# Patient Record
Sex: Female | Born: 1975 | Race: White | Hispanic: No | Marital: Married | Smoking: Never smoker
Health system: Southern US, Community
[De-identification: ages and names within clinical notes are randomized; demographics above are authoritative.]

## PROBLEM LIST (undated history)

## (undated) DIAGNOSIS — R000 Tachycardia, unspecified: Secondary | ICD-10-CM

## (undated) DIAGNOSIS — E785 Hyperlipidemia, unspecified: Secondary | ICD-10-CM

## (undated) DIAGNOSIS — R002 Palpitations: Secondary | ICD-10-CM

## (undated) DIAGNOSIS — K589 Irritable bowel syndrome without diarrhea: Secondary | ICD-10-CM

## (undated) DIAGNOSIS — J309 Allergic rhinitis, unspecified: Secondary | ICD-10-CM

## (undated) DIAGNOSIS — M1990 Unspecified osteoarthritis, unspecified site: Secondary | ICD-10-CM

## (undated) HISTORY — DX: Irritable bowel syndrome, unspecified: K58.9

## (undated) HISTORY — DX: Tachycardia, unspecified: R00.0

## (undated) HISTORY — DX: Allergic rhinitis, unspecified: J30.9

## (undated) HISTORY — PX: WISDOM TOOTH EXTRACTION: SHX21

## (undated) HISTORY — DX: Unspecified osteoarthritis, unspecified site: M19.90

## (undated) HISTORY — DX: Hyperlipidemia, unspecified: E78.5

## (undated) HISTORY — DX: Palpitations: R00.2

---

## 2001-03-08 ENCOUNTER — Other Ambulatory Visit: Admission: RE | Admit: 2001-03-08 | Discharge: 2001-03-08 | Payer: Self-pay | Admitting: Obstetrics and Gynecology

## 2001-04-26 ENCOUNTER — Encounter (INDEPENDENT_AMBULATORY_CARE_PROVIDER_SITE_OTHER): Payer: Self-pay | Admitting: Specialist

## 2001-04-26 ENCOUNTER — Other Ambulatory Visit: Admission: RE | Admit: 2001-04-26 | Discharge: 2001-04-26 | Payer: Self-pay | Admitting: Gynecology

## 2001-10-13 ENCOUNTER — Other Ambulatory Visit: Admission: RE | Admit: 2001-10-13 | Discharge: 2001-10-13 | Payer: Self-pay | Admitting: Gynecology

## 2002-02-06 ENCOUNTER — Other Ambulatory Visit: Admission: RE | Admit: 2002-02-06 | Discharge: 2002-02-06 | Payer: Self-pay | Admitting: Gynecology

## 2002-04-26 ENCOUNTER — Other Ambulatory Visit: Admission: RE | Admit: 2002-04-26 | Discharge: 2002-04-26 | Payer: Self-pay | Admitting: Gynecology

## 2002-08-16 ENCOUNTER — Other Ambulatory Visit: Admission: RE | Admit: 2002-08-16 | Discharge: 2002-08-16 | Payer: Self-pay | Admitting: Gynecology

## 2003-04-19 ENCOUNTER — Encounter: Admission: RE | Admit: 2003-04-19 | Discharge: 2003-04-19 | Payer: Self-pay | Admitting: *Deleted

## 2003-04-19 ENCOUNTER — Encounter: Payer: Self-pay | Admitting: *Deleted

## 2003-06-27 ENCOUNTER — Other Ambulatory Visit: Admission: RE | Admit: 2003-06-27 | Discharge: 2003-06-27 | Payer: Self-pay | Admitting: Gynecology

## 2004-07-07 ENCOUNTER — Other Ambulatory Visit: Admission: RE | Admit: 2004-07-07 | Discharge: 2004-07-07 | Payer: Self-pay | Admitting: Gynecology

## 2005-05-06 ENCOUNTER — Other Ambulatory Visit: Admission: RE | Admit: 2005-05-06 | Discharge: 2005-05-06 | Payer: Self-pay | Admitting: Gynecology

## 2005-09-01 ENCOUNTER — Encounter: Admission: RE | Admit: 2005-09-01 | Discharge: 2005-09-01 | Payer: Self-pay | Admitting: Gynecology

## 2005-11-12 ENCOUNTER — Inpatient Hospital Stay (HOSPITAL_COMMUNITY): Admission: AD | Admit: 2005-11-12 | Discharge: 2005-11-17 | Payer: Self-pay | Admitting: Gynecology

## 2005-11-14 ENCOUNTER — Encounter (INDEPENDENT_AMBULATORY_CARE_PROVIDER_SITE_OTHER): Payer: Self-pay | Admitting: Specialist

## 2005-12-30 ENCOUNTER — Other Ambulatory Visit: Admission: RE | Admit: 2005-12-30 | Discharge: 2005-12-30 | Payer: Self-pay | Admitting: Gynecology

## 2007-12-19 ENCOUNTER — Inpatient Hospital Stay (HOSPITAL_COMMUNITY): Admission: RE | Admit: 2007-12-19 | Discharge: 2007-12-22 | Payer: Self-pay | Admitting: Obstetrics & Gynecology

## 2010-02-10 ENCOUNTER — Inpatient Hospital Stay (HOSPITAL_COMMUNITY): Admission: EM | Admit: 2010-02-10 | Discharge: 2010-02-15 | Payer: Self-pay | Admitting: Emergency Medicine

## 2010-02-12 ENCOUNTER — Encounter (INDEPENDENT_AMBULATORY_CARE_PROVIDER_SITE_OTHER): Payer: Self-pay | Admitting: Internal Medicine

## 2011-01-28 LAB — HEPARIN INDUCED THROMBOCYTOPENIA PNL
Heparin Induced Plt Ab: NEGATIVE
Patient O.D.: 0.091
UFH Low Dose 0.1 IU/mL: 0 % Release

## 2011-01-28 LAB — CLOSTRIDIUM DIFFICILE EIA

## 2011-01-28 LAB — COMPREHENSIVE METABOLIC PANEL
Albumin: 2.5 g/dL — ABNORMAL LOW (ref 3.5–5.2)
Albumin: 3.5 g/dL (ref 3.5–5.2)
BUN: 2 mg/dL — ABNORMAL LOW (ref 6–23)
BUN: 6 mg/dL (ref 6–23)
Calcium: 8.4 mg/dL (ref 8.4–10.5)
Creatinine, Ser: 0.56 mg/dL (ref 0.4–1.2)
Creatinine, Ser: 0.67 mg/dL (ref 0.4–1.2)
Potassium: 4 mEq/L (ref 3.5–5.1)
Total Bilirubin: 0.7 mg/dL (ref 0.3–1.2)
Total Protein: 4.9 g/dL — ABNORMAL LOW (ref 6.0–8.3)
Total Protein: 6.3 g/dL (ref 6.0–8.3)

## 2011-01-28 LAB — BASIC METABOLIC PANEL
BUN: 3 mg/dL — ABNORMAL LOW (ref 6–23)
CO2: 21 mEq/L (ref 19–32)
CO2: 22 mEq/L (ref 19–32)
Calcium: 8.3 mg/dL — ABNORMAL LOW (ref 8.4–10.5)
Chloride: 108 mEq/L (ref 96–112)
Chloride: 111 mEq/L (ref 96–112)
Creatinine, Ser: 0.69 mg/dL (ref 0.4–1.2)
GFR calc Af Amer: >60 mL/min (ref >60)
GFR calc non Af Amer: >60 mL/min (ref >60)
GFR calc non Af Amer: >60 mL/min (ref >60)
Glucose, Bld: 91 mg/dL (ref 70–99)
Potassium: 4.5 mEq/L (ref 3.5–5.1)
Sodium: 137 mEq/L (ref 135–145)

## 2011-01-28 LAB — URINALYSIS, ROUTINE W REFLEX MICROSCOPIC
Glucose, UA: NEGATIVE mg/dL
Hgb urine dipstick: NEGATIVE
Specific Gravity, Urine: 1.023 (ref 1.005–1.030)
Urobilinogen, UA: 0.2 mg/dL (ref 0.0–1.0)

## 2011-01-28 LAB — CBC
HCT: 33.6 % — ABNORMAL LOW (ref 36.0–46.0)
HCT: 35.3 % — ABNORMAL LOW (ref 36.0–46.0)
HCT: 43.8 % (ref 36.0–46.0)
Hemoglobin: 12.2 g/dL (ref 12.0–15.0)
MCV: 86.3 fL (ref 78.0–100.0)
MCV: 87.2 fL (ref 78.0–100.0)
Platelets: 159 10*3/uL (ref 150–400)
Platelets: 185 10*3/uL (ref 150–400)
RDW: 12.6 % (ref 11.5–15.5)
RDW: 13 % (ref 11.5–15.5)
RDW: 13 % (ref 11.5–15.5)

## 2011-01-28 LAB — PHOSPHORUS: Phosphorus: 2.3 mg/dL (ref 2.3–4.6)

## 2011-01-28 LAB — DIFFERENTIAL
Basophils Absolute: 0.1 10*3/uL (ref 0.0–0.1)
Lymphocytes Relative: 10 % — ABNORMAL LOW (ref 12–46)
Monocytes Absolute: 0.8 10*3/uL (ref 0.1–1.0)
Neutro Abs: 11.5 10*3/uL — ABNORMAL HIGH (ref 1.7–7.7)
Neutrophils Relative %: 83 % — ABNORMAL HIGH (ref 43–77)

## 2011-01-28 LAB — MAGNESIUM: Magnesium: 1.8 mg/dL (ref 1.5–2.5)

## 2011-01-28 LAB — PROTIME-INR: INR: 1.01 (ref 0.00–1.49)

## 2011-01-28 LAB — PREGNANCY, URINE: Preg Test, Ur: NEGATIVE

## 2011-03-24 NOTE — Op Note (Signed)
NAME:  Rhonda Drake, Rhonda Drake            ACCOUNT NO.:  [id number]   MEDICAL RECORD NO.:  [id number]          PATIENT TYPE:  INP   LOCATION:  9113                          FACILITY:  [facility]   PHYSICIAN:  Genia Del, M.D.DATE OF BIRTH:  1976-09-21   DATE OF PROCEDURE:  12/19/2007  DATE OF DISCHARGE:                               OPERATIVE REPORT   PREOPERATIVE DIAGNOSES:  Thirty-nine weeks gestation, previous cesarean  section.   POSTOPERATIVE DIAGNOSES:  Thirty-nine weeks gestation, previous cesarean  section.   PROCEDURE:  Repeat elective low transverse C-section.   ASSISTANT:  Marlinda Mike, C.N.M.   ANESTHESIOLOGIST:  Octaviano Glow. Pamalee Leyden, M.D.   PROCEDURE:  Under spinal anesthesia, the patient is in the 15-degrees  left decubitus position.  She was prepped with Betadine on the  abdominal, suprapubic, vulvar and vaginal areas, and draped as usual.  The bladder catheter is inserted, the Foley is left in place.  We  verified the level of anesthesia which was adequate.  We infiltrated the  subcutaneous tissue with Marcaine 0.25 plain 20 mL at the site of the  previous C-section.  We opened with the scalpel at the site of the  previous C-section scar in a Pfannenstiel incision.  We opened the  adipose tissue with the electrocautery cutting mode.  We opened the  aponeurosis transversely with the electrocautery and the Mayo scissors.  We detached the recti muscles from the aponeurosis on the midline  superiorly and inferiorly.  We opened the parietal peritoneum  longitudinally with [facility] scissors.  We then placed the bladder  retractor.  We opened the visceral peritoneum over the lower uterine  segment transversely with Mayo scissors and reclined the bladder  downward.  We repositioned the bladder retractor.  We make a low  transverse hysterotomy with a scalpel.  We extended on each side with  dressing scissors.  The amniotic fluid is clear.  The fetus is in  cephalic presentation.  Birth  of a baby boy, Apgars are 9 and 9, the  cord is clamped and cut, the baby is given to the neonatal team.  We  evacuated the placenta spontaneously, it is complete with 3 vessels.  It  is sent to labor and delivery.  Uterine revision done, the uterus  contracts well.  Pitocin was started in the IV fluids.  A dose of Flagyl  500 IV is given.  We exteriorized the uterus.  We closed the hysterotomy  with first a locked running suture of Vicryl 0, a second layer in a  mattress fashion is done with Vicryl 0.  Hemostasis is adequate at all  levels.  The tubes and ovaries are normal in size and appearance.  We  repositioned the uterus inside the abdomen.  We then verified hemostasis  on the recti muscles, aponeurosis and bladder flap.  We completed  hemostasis with the electrocautery where necessary.  We then closed the  aponeurosis in 2 half running sutures of Vicryl 0.  We completed  hemostasis on the adipose tissue with the electrocautery.  We  reapproximated the skin with staples and a dry dressing is applied.  The  count of instruments and sponges was complete x2.  The estimated blood  loss was 600 mL and no complications occurred.  The patient was brought  to recovery room in good stable status .     Genia Del, M.D.  Electronically Signed    ML/MEDQ  D:  12/19/2007  T:  12/20/2007  Job:  [id number]

## 2011-03-27 NOTE — Op Note (Signed)
NAME:  Rhonda Drake, Rhonda Drake            ACCOUNT NO.:  [id number]   MEDICAL RECORD NO.:  [id number]          PATIENT TYPE:  INP   LOCATION:  9117                          FACILITY:  [facility]   PHYSICIAN:  Juan H. Lily Peer, M.D.DATE OF BIRTH:  Jun 02, 1976   DATE OF PROCEDURE:  11/14/2005  DATE OF DISCHARGE:                                 OPERATIVE REPORT   Juan H. Lily Peer, M.D.   ASSISTANT:  Naima A. Normand Sloop, M.D.   INDICATIONS FOR OPERATION:  A 35 year old gravida 1, para 0, at 39-6/7  weeks' gestation admitted for induction secondary to dates and gestational  diabetes.  She had failed two days of induction with essentially no change  in her cervix and was taken to the operating room for a primary cesarean  section.   PREOPERATIVE DIAGNOSIS:  1.  Term intrauterine pregnancy.  2.  Gestational diabetic.  3.  Failed induction.   POSTOPERATIVE DIAGNOSIS:  1.  Term intrauterine pregnancy.  2.  Gestational diabetes.  3.  Failed induction.  4.  Fetal macrosomia.   ANESTHESIA:  Spinal.   PROCEDURE PERFORMED:  Primary lower uterine segment transverse cesarean  section.   FINDINGS:  Viable female infant, weight of 9 pounds 10 ounces.  Arterial cord  pH of 7.27, Apgars of 9 and 9.  Normal maternal pelvic anatomy.   DESCRIPTION OF OPERATION:  After the patient was taken to the operating room  and underwent successful spinal anesthesia, fetal heart tones were  appreciated, and the abdomen was then prepped and draped in the usual  sterile fashion.  A Pfannenstiel skin incision was made after a Foley  catheter had been inserted in an effort to monitor urinary output.  The  Pfannenstiel skin incision was carried down from the skin, subcutaneous  tissue down to the rectus fascia whereby a midline nick was made.  The  fascia was incised in a transverse fashion.  The peritoneal cavity was  entered cautiously.  The bladder flap was then was developed and the lower  uterine segment was incised in a  transverse fashion.  Clear amniotic fluid  was present.  The newborn's head was delivered. The nasopharyngeal area was  bulb suctioned. The newborn was delivered.  The cord was doubly clamped and  excised.  The newborn gave an immediate cry, was shown to the parents and  passed off to the neonatologist who was in attendance, again with the above-  mentioned parameters.  After cord blood was obtained, the placenta was  delivered from the intrauterine cavity and submitted for histological  evaluation.  Pitocin drip was started.  Due to the patient's allergy to  penicillin, she was given clindamycin 900 mg IV for prophylaxis.  The uterus  was then exteriorized.  The intrauterine cavity was swept clear of remaining  products of conception.  The uterine incision was closed in a double layer  fashion, first with a locking stitch of 0 Vicryl suture, the second layer an  imbricating stitch with similar suture material.  Normal pelvic anatomy was  noted.  The uterus was placed back in the pelvic cavity.  The pelvic cavity  was copiously irrigated with normal saline solution.  After ascertaining  adequate hemostasis, closure was started.  The visceral peritoneum was not  reapproximated, but the rectus fascia was closed with a running stitch of 0  Vicryl suture/  Marcaine 0.25% was infiltrated subcutaneously, and then the  subcutaneous bleeders were Bovie cauterized and skin was reapproximated with  skin clips followed by placing  Xeroform gauze and 4 x 8 dressing.  The  patient was transferred to the recovery room with stable vital signs.  Blood  loss procedure was 750 mL.  Urine output was 300 mL and clear, and IV fluids  consisted of 2300 mL of lactated Ringer's.      Juan H. Lily Peer, M.D.  Electronically Signed     JHF/MEDQ  D:  11/14/2005  T:  11/14/2005  Job:  [id number]

## 2011-03-27 NOTE — H&P (Signed)
NAME:  JOZELYNN, DANIELSON            ACCOUNT NO.:  [id number]   MEDICAL RECORD NO.:  [id number]          PATIENT TYPE:  INP   LOCATION:  9164                          FACILITY:  [facility]   PHYSICIAN:  Ivor Costa. Farrel Gobble, M.D. DATE OF BIRTH:  [birth year]   DATE OF ADMISSION:  11/12/2005  DATE OF DISCHARGE:                                HISTORY & PHYSICAL   CHIEF COMPLAINT:  39-6/7 weeks pregnancy with gestational diabetes.   HISTORY OF PRESENT ILLNESS:  The patient is a 35 year old G1 with an  estimated date of confinement of November 17, 2005, estimated gestational age  of 39-5/7 weeks who presents for an induction of labor secondary to her  gestational diabetes status. The patient has been controlled with diet and  her finger sticks have all been normal. The patient has been followed with  NSTs because of the gestational diabetes and they have all been reactive.  The patient is without any complaints at this point. She reports good fetal  movement, no vaginal bleeding and no contractions. When she presented today  the patient was noted to have a fundal height of 40. Her cervix was long and  closed and the vertex was not in the pelvis. Therefore the patient had an  ultrasound done which was consistent with gestational age  with an estimated fetal weight of 3790 kg and an AFI of 17.6. Her nonstress  test today was reactive. Because her cervix is unfavorable the patient was  offered a delay in her induction, however she did decline. She is antibody  negative. RPR nonreactive. Rubella immune. Hepatitis B surface antigen  nonreactive. HIV nonreactive. GBS negative. Refer to the [facility].   PHYSICAL EXAMINATION:  GENERAL:  She is a well-appearing gravida in no acute  distress.  HEART:  Regular rate.  LUNGS:  Clear to auscultation.  ABDOMEN:  Gravid and nontender with a fundal height of 40.  VAGINAL EXAM:  Her cervix is long, closed, and posterior.  EXTREMITIES:  Remarkable only for trace  edema.   ASSESSMENT:  Term pregnancy, A1 diabetes.  The patient will be admitted for Cervidil this evening followed by high dose  Pitocin in the morning. She was counseled with regards to the possibility  for a failed induction as well as serial induction and all questions were  addressed and accepted.      Ivor Costa. Farrel Gobble, M.D.  Electronically Signed     THL/MEDQ  D:  11/12/2005  T:  11/12/2005  Job:  [id number]

## 2011-03-27 NOTE — Discharge Summary (Signed)
NAME:  Rhonda Drake, Rhonda Drake            ACCOUNT NO.:  [id number]   MEDICAL RECORD NO.:  [id number]          PATIENT TYPE:  INP   LOCATION:  9117                          [facility]ACILITY:  [facility]   PHYSICIAN:  Timothy P. Fontaine, M.D.DATE OF BIRTH:  1976/05/04   DATE OF ADMISSION:  11/12/2005  DATE OF DISCHARGE:                                 DISCHARGE SUMMARY   DISCHARGE DIAGNOSES:  1.  Pregnancy at 39 weeks, delivered.  2.  Gestational diabetes.  3.  Failed induction.   PROCEDURE:  Primary low transverse cervical cesarean section, Dr. Reynaldo Minium - November 14, 2005.   HOSPITAL COURSE:  A 35 year old G1 P0 female at [redacted] weeks gestation,  gestational diabetes, admitted for induction. The patient underwent a staged  two-day induction with Cervidil and Pitocin. Her cervix remained unfavorable  at long, closed, posterior, and she ultimately underwent a primary cesarean  section producing a 9-pound 10-ounce female infant, Apgars 9 and 9. The  patient's postoperative course was uncomplicated and she was discharged on  postoperative day #3 ambulating well, tolerating a regular diet, with a  postoperative hemoglobin of 10.4. The patient is Rh negative and the infant  was reported to be Rh negative. The patient's rubella titer is positive. The  patient received precautions, instructions and follow-up, will be seen in  the office in 6 weeks, received a prescription for Tylox #30 one to two p.o.  q.4-6h. p.r.n. pain.      Timothy P. Fontaine, M.D.  Electronically Signed     TPF/MEDQ  D:  11/17/2005  T:  11/17/2005  Job:  [id number]

## 2011-03-27 NOTE — Discharge Summary (Signed)
Rhonda Drake, Rhonda Drake            ACCOUNT NO.:  [id number]   MEDICAL RECORD NO.:  [id number]          PATIENT TYPE:  INP   LOCATION:  9113                          FACILITY:  [facility]   PHYSICIAN:  Genia Del, M.D.DATE OF BIRTH:  18-Dec-1975   DATE OF ADMISSION:  12/19/2007  DATE OF DISCHARGE:  12/22/2007                               DISCHARGE SUMMARY   ADMISSION DIAGNOSIS:  A 39 weeks' gestation, previous cesarean section.   DISCHARGE DIAGNOSIS:  1. A 39 weeks' gestation, cesarean section.  2. Birth of a healthy baby boy.   PROCEDURE:  Repeat low-transverse cesarean section.   HOSPITAL COURSE:  The C-section was without any complication.  The  patient had a normal postop course.  She remained afebrile and  hemodynamically stable.  Her hemoglobin postop was at 11.1 with a  hematocrit of 31.4.  She had a mild decrease in her platelets at 112 and  they were going up on the day prior to discharge at 120.  The patient  was discharge on postop day #3 in stable status.  Postop advice were  given.  Pain medication was prescribed.  The patient will follow up at  [facility] in 6 weeks.      Genia Del, M.D.  Electronically Signed     ML/MEDQ  D:  01/26/2008  T:  01/27/2008  Job:  [id number]

## 2011-03-30 ENCOUNTER — Other Ambulatory Visit: Payer: Self-pay | Admitting: Obstetrics & Gynecology

## 2011-07-31 LAB — CBC
HCT: 31.4 — ABNORMAL LOW
HCT: 31.8 — ABNORMAL LOW
HCT: 39.3
Hemoglobin: 11.1 — ABNORMAL LOW
Hemoglobin: 11.3 — ABNORMAL LOW
Hemoglobin: 13.9
MCHC: 35.5
MCV: 86.1
Platelets: 120 — ABNORMAL LOW
RBC: 4.56
RDW: 16 — ABNORMAL HIGH
WBC: 11.5 — ABNORMAL HIGH
WBC: 9.9

## 2011-07-31 LAB — RH IMMUNE GLOB WKUP(>/=20WKS)(NOT WOMEN'S HOSP)

## 2013-09-27 ENCOUNTER — Other Ambulatory Visit: Payer: Self-pay | Admitting: Family Medicine

## 2013-09-27 ENCOUNTER — Ambulatory Visit
Admission: RE | Admit: 2013-09-27 | Discharge: 2013-09-27 | Disposition: A | Discharge status: Home / Self Care | Payer: BC Managed Care – PPO | Source: Ambulatory Visit | Attending: Family Medicine | Admitting: Family Medicine

## 2013-09-27 DIAGNOSIS — M79671 Pain in right foot: Secondary | ICD-10-CM

## 2014-02-26 ENCOUNTER — Other Ambulatory Visit: Payer: Self-pay | Admitting: Dermatology

## 2017-06-07 ENCOUNTER — Encounter: Payer: Self-pay | Admitting: Obstetrics & Gynecology

## 2017-11-23 ENCOUNTER — Encounter: Payer: Self-pay | Admitting: Obstetrics & Gynecology

## 2017-11-23 ENCOUNTER — Ambulatory Visit: Payer: BC Managed Care – PPO | Admitting: Obstetrics & Gynecology

## 2017-11-23 VITALS — BP 130/86 | Ht 63.75 in | Wt 168.0 lb

## 2017-11-23 DIAGNOSIS — Z3041 Encounter for surveillance of contraceptive pills: Secondary | ICD-10-CM

## 2017-11-23 DIAGNOSIS — Z01419 Encounter for gynecological examination (general) (routine) without abnormal findings: Secondary | ICD-10-CM | POA: Diagnosis not present

## 2017-11-23 MED ORDER — DROSPIRENONE-ETHINYL ESTRADIOL 3-0.02 MG PO TABS: 1.0000 | ORAL_TABLET | Freq: Every day | ORAL | 4 refills | Status: DC

## 2017-11-23 NOTE — Patient Instructions (Signed)
1. Well female exam with routine gynecological exam Normal gynecologic exam.  Last Pap test December 2000 17- with HPV high risk negative.  Will repeat Pap test next year.  Breast exam normal.  Screening mammogram negative in July 2018.  Physically active and working on weight loss currently.  2. Encounter for surveillance of contraceptive pills Loryna represcribed.  Doing well on continuous use.  No contraindication.  Other orders - drospirenone-ethinyl estradiol (YAZ,GIANVI,LORYNA) 3-0.02 MG tablet; Take 1 tablet by mouth daily. Continuous use  [place], it was a pleasure seeing you today!  Health Maintenance, Female Adopting a healthy lifestyle and getting preventive care can go a long way to promote health and wellness. Talk with your health care provider about what schedule of regular examinations is right for you. This is a good chance for you to check in with your provider about disease prevention and staying healthy. In between checkups, there are plenty of things you can do on your own. Experts have done a lot of research about which lifestyle changes and preventive measures are most likely to keep you healthy. Ask your health care provider for more information. Weight and diet Eat a healthy diet  Be sure to include plenty of vegetables, fruits, low-fat dairy products, and lean protein.  Do not eat a lot of foods high in solid fats, added sugars, or salt.  Get regular exercise. This is one of the most important things you can do for your health. ? Most adults should exercise for at least 150 minutes each week. The exercise should increase your heart rate and make you sweat (moderate-intensity exercise). ? Most adults should also do strengthening exercises at least twice a week. This is in addition to the moderate-intensity exercise.  Maintain a healthy weight  Body mass index (BMI) is a measurement that can be used to identify possible weight problems. It estimates body fat based on  height and weight. Your health care provider can help determine your BMI and help you achieve or maintain a healthy weight.  For females 46 years of age and older: ? A BMI below 18.5 is considered underweight. ? A BMI of 18.5 to 24.9 is normal. ? A BMI of 25 to 29.9 is considered overweight. ? A BMI of 30 and above is considered obese.  Watch levels of cholesterol and blood lipids  You should start having your blood tested for lipids and cholesterol at 42 years of age, then have this test every 5 years.  You may need to have your cholesterol levels checked more often if: ? Your lipid or cholesterol levels are high. ? You are older than 42 years of age. ? You are at high risk for heart disease.  Cancer screening Lung Cancer  Lung cancer screening is recommended for adults 10-84 years old who are at high risk for lung cancer because of a history of smoking.  A yearly low-dose CT scan of the lungs is recommended for people who: ? Currently smoke. ? Have quit within the past 15 years. ? Have at least a 30-pack-year history of smoking. A pack year is smoking an average of one pack of cigarettes a day for 1 year.  Yearly screening should continue until it has been 15 years since you quit.  Yearly screening should stop if you develop a health problem that would prevent you from having lung cancer treatment.  Breast Cancer  Practice breast self-awareness. This means understanding how your breasts normally appear and feel.  It also  means doing regular breast self-exams. Let your health care provider know about any changes, no matter how small.  If you are in your 20s or 30s, you should have a clinical breast exam (CBE) by a health care provider every 1-3 years as part of a regular health exam.  If you are 70 or older, have a CBE every year. Also consider having a breast X-ray (mammogram) every year.  If you have a family history of breast cancer, talk to your health care provider  about genetic screening.  If you are at high risk for breast cancer, talk to your health care provider about having an MRI and a mammogram every year.  Breast cancer gene (BRCA) assessment is recommended for women who have family members with BRCA-related cancers. BRCA-related cancers include: ? Breast. ? Ovarian. ? Tubal. ? Peritoneal cancers.  Results of the assessment will determine the need for genetic counseling and BRCA1 and BRCA2 testing.  Cervical Cancer Your health care provider may recommend that you be screened regularly for cancer of the pelvic organs (ovaries, uterus, and vagina). This screening involves a pelvic examination, including checking for microscopic changes to the surface of your cervix (Pap test). You may be encouraged to have this screening done every 3 years, beginning at age 8.  For women ages 4-65, health care providers may recommend pelvic exams and Pap testing every 3 years, or they may recommend the Pap and pelvic exam, combined with testing for human papilloma virus (HPV), every 5 years. Some types of HPV increase your risk of cervical cancer. Testing for HPV may also be done on women of any age with unclear Pap test results.  Other health care providers may not recommend any screening for nonpregnant women who are considered low risk for pelvic cancer and who do not have symptoms. Ask your health care provider if a screening pelvic exam is right for you.  If you have had past treatment for cervical cancer or a condition that could lead to cancer, you need Pap tests and screening for cancer for at least 20 years after your treatment. If Pap tests have been discontinued, your risk factors (such as having a new sexual partner) need to be reassessed to determine if screening should resume. Some women have medical problems that increase the chance of getting cervical cancer. In these cases, your health care provider may recommend more frequent screening and Pap  tests.  Colorectal Cancer  This type of cancer can be detected and often prevented.  Routine colorectal cancer screening usually begins at 42 years of age and continues through 42 years of age.  Your health care provider may recommend screening at an earlier age if you have risk factors for colon cancer.  Your health care provider may also recommend using home test kits to check for hidden blood in the stool.  A small camera at the end of a tube can be used to examine your colon directly (sigmoidoscopy or colonoscopy). This is done to check for the earliest forms of colorectal cancer.  Routine screening usually begins at age 62.  Direct examination of the colon should be repeated every 5-10 years through 42 years of age. However, you may need to be screened more often if early forms of precancerous polyps or small growths are found.  Skin Cancer  Check your skin from head to toe regularly.  Tell your health care provider about any new moles or changes in moles, especially if there is a change in  a mole's shape or color.  Also tell your health care provider if you have a mole that is larger than the size of a pencil eraser.  Always use sunscreen. Apply sunscreen liberally and repeatedly throughout the day.  Protect yourself by wearing long sleeves, pants, a wide-brimmed hat, and sunglasses whenever you are outside.  Heart disease, diabetes, and high blood pressure  High blood pressure causes heart disease and increases the risk of stroke. High blood pressure is more likely to develop in: ? People who have blood pressure in the high end of the normal range (130-139/85-89 mm Hg). ? People who are overweight or obese. ? People who are African American.  If you are 23-30 years of age, have your blood pressure checked every 3-5 years. If you are 30 years of age or older, have your blood pressure checked every year. You should have your blood pressure measured twice-once when you are at  a hospital or clinic, and once when you are not at a hospital or clinic. Record the average of the two measurements. To check your blood pressure when you are not at a hospital or clinic, you can use: ? An automated blood pressure machine at a pharmacy. ? A home blood pressure monitor.  If you are between 24 years and 59 years old, ask your health care provider if you should take aspirin to prevent strokes.  Have regular diabetes screenings. This involves taking a blood sample to check your fasting blood sugar level. ? If you are at a normal weight and have a low risk for diabetes, have this test once every three years after 42 years of age. ? If you are overweight and have a high risk for diabetes, consider being tested at a younger age or more often. Preventing infection Hepatitis B  If you have a higher risk for hepatitis B, you should be screened for this virus. You are considered at high risk for hepatitis B if: ? You were born in a country where hepatitis B is common. Ask your health care provider which countries are considered high risk. ? Your parents were born in a high-risk country, and you have not been immunized against hepatitis B (hepatitis B vaccine). ? You have HIV or AIDS. ? You use needles to inject street drugs. ? You live with someone who has hepatitis B. ? You have had sex with someone who has hepatitis B. ? You get hemodialysis treatment. ? You take certain medicines for conditions, including cancer, organ transplantation, and autoimmune conditions.  Hepatitis C  Blood testing is recommended for: ? Everyone born from [birth year] through 1965. ? Anyone with known risk factors for hepatitis C.  Sexually transmitted infections (STIs)  You should be screened for sexually transmitted infections (STIs) including gonorrhea and chlamydia if: ? You are sexually active and are younger than 42 years of age. ? You are older than 42 years of age and your health care provider tells  you that you are at risk for this type of infection. ? Your sexual activity has changed since you were last screened and you are at an increased risk for chlamydia or gonorrhea. Ask your health care provider if you are at risk.  If you do not have HIV, but are at risk, it may be recommended that you take a prescription medicine daily to prevent HIV infection. This is called pre-exposure prophylaxis (PrEP). You are considered at risk if: ? You are sexually active and do not regularly use condoms  or know the HIV status of your partner(s). ? You take drugs by injection. ? You are sexually active with a partner who has HIV.  Talk with your health care provider about whether you are at high risk of being infected with HIV. If you choose to begin PrEP, you should first be tested for HIV. You should then be tested every 3 months for as long as you are taking PrEP. Pregnancy  If you are premenopausal and you may become pregnant, ask your health care provider about preconception counseling.  If you may become pregnant, take 400 to 800 micrograms (mcg) of folic acid every day.  If you want to prevent pregnancy, talk to your health care provider about birth control (contraception). Osteoporosis and menopause  Osteoporosis is a disease in which the bones lose minerals and strength with aging. This can result in serious bone fractures. Your risk for osteoporosis can be identified using a bone density scan.  If you are 85 years of age or older, or if you are at risk for osteoporosis and fractures, ask your health care provider if you should be screened.  Ask your health care provider whether you should take a calcium or vitamin D supplement to lower your risk for osteoporosis.  Menopause may have certain physical symptoms and risks.  Hormone replacement therapy may reduce some of these symptoms and risks. Talk to your health care provider about whether hormone replacement therapy is right for  you. Follow these instructions at home:  Schedule regular health, dental, and eye exams.  Stay current with your immunizations.  Do not use any tobacco products including cigarettes, chewing tobacco, or electronic cigarettes.  If you are pregnant, do not drink alcohol.  If you are breastfeeding, limit how much and how often you drink alcohol.  Limit alcohol intake to no more than 1 drink per day for nonpregnant women. One drink equals 12 ounces of beer, 5 ounces of wine, or 1 ounces of hard liquor.  Do not use street drugs.  Do not share needles.  Ask your health care provider for help if you need support or information about quitting drugs.  Tell your health care provider if you often feel depressed.  Tell your health care provider if you have ever been abused or do not feel safe at home. This information is not intended to replace advice given to you by your health care provider. Make sure you discuss any questions you have with your health care provider. Document Released: 05/11/2011 Document Revised: 04/02/2016 Document Reviewed: 07/30/2015 Elsevier Interactive Patient Education  [employer].

## 2017-11-23 NOTE — Progress Notes (Signed)
Rhonda Drake 02-09-1976 [id number]   History:    42 y.o. G2P2L2 Married.  [employer], broadcasting/film/video, elementary school.  2 boys, 9 and 12 doing well, playing soccer, good in math.  RP:  Established patient presenting for annual gyn exam   HPI: Well on Loryna continuous use.  No breakthrough bleeding.  No pelvic pain.  Normal vaginal secretions.  Urine normal.  Treated a rare cystitis last week.  Bowel movements varying between constipation and loose stools.  Will see gastroenterologist shortly.  Breasts normal.  Health labs with family physician.  Physically active.  In a contest for biggest loser at work.  Body mass index 29.06.  Past medical history,surgical history, family history and social history were all reviewed and documented in the EPIC chart.  Gynecologic History No LMP recorded. Patient is not currently having periods (Reason: Oral contraceptives). Contraception: OCP (estrogen/progesterone) Last Pap: 10/2016. Results were: Negative, HPV HR neg Last mammogram: 05/2017. Results were: Negative  Obstetric History OB History  Gravida Para Term Preterm AB Living  2 2       2   SAB TAB Ectopic Multiple Live Births               # Outcome Date GA Lbr Len/2nd Weight Sex Delivery Anes PTL Lv  2 Para           1 Para                ROS: A ROS was performed and pertinent positives and negatives are included in the history.  GENERAL: No fevers or chills. HEENT: No change in vision, no earache, sore throat or sinus congestion. NECK: No pain or stiffness. CARDIOVASCULAR: No chest pain or pressure. No palpitations. PULMONARY: No shortness of breath, cough or wheeze. GASTROINTESTINAL: No abdominal pain, nausea, vomiting or diarrhea, melena or bright red blood per rectum. GENITOURINARY: No urinary frequency, urgency, hesitancy or dysuria. MUSCULOSKELETAL: No joint or muscle pain, no back pain, no recent trauma. DERMATOLOGIC: No rash, no itching, no lesions. ENDOCRINE: No polyuria, polydipsia, no  heat or cold intolerance. No recent change in weight. HEMATOLOGICAL: No anemia or easy bruising or bleeding. NEUROLOGIC: No headache, seizures, numbness, tingling or weakness. PSYCHIATRIC: No depression, no loss of interest in normal activity or change in sleep pattern.     Exam:   BP 130/86   Ht 5' 3.75" (1.619 m)   Wt 168 lb (76.2 kg)   BMI 29.06 kg/m   Body mass index is 29.06 kg/m.  General appearance : Well developed well nourished female. No acute distress HEENT: Eyes: no retinal hemorrhage or exudates,  Neck supple, trachea midline, no carotid bruits, no thyroidmegaly Lungs: Clear to auscultation, no rhonchi or wheezes, or rib retractions  Heart: Regular rate and rhythm, no murmurs or gallops Breast:Examined in sitting and supine position were symmetrical in appearance, no palpable masses or tenderness,  no skin retraction, no nipple inversion, no nipple discharge, no skin discoloration, no axillary or supraclavicular lymphadenopathy Abdomen: no palpable masses or tenderness, no rebound or guarding Extremities: no edema or skin discoloration or tenderness  Pelvic: Vulva normal  Bartholin, Urethra, Skene Glands: Within normal limits             Vagina: No gross lesions or discharge  Cervix: No gross lesions or discharge  Uterus  RV, normal size, shape and consistency, non-tender and mobile  Adnexa  Without masses or tenderness  Anus and perineum  normal    Assessment/Plan:  42 y.o.  female for annual exam   1. Well female exam with routine gynecological exam Normal gynecologic exam.  Last Pap test December 2000 17- with HPV high risk negative.  Will repeat Pap test next year.  Breast exam normal.  Screening mammogram negative in July 2018.  Physically active and working on weight loss currently.  2. Encounter for surveillance of contraceptive pills Loryna represcribed.  Doing well on continuous use.  No contraindication.  Other orders - drospirenone-ethinyl estradiol  (YAZ,GIANVI,LORYNA) 3-0.02 MG tablet; Take 1 tablet by mouth daily. Continuous use  Genia DelMarie-Lyne Aletta Edmunds MD, 12:23 PM 11/23/2017

## 2018-06-14 ENCOUNTER — Encounter: Payer: Self-pay | Admitting: Obstetrics & Gynecology

## 2018-08-19 ENCOUNTER — Telehealth: Payer: Self-pay

## 2018-08-19 MED ORDER — METOPROLOL SUCCINATE ER 25 MG PO TB24: 25.0000 mg | ORAL_TABLET | Freq: Every day | ORAL | 0 refills | Status: DC

## 2018-08-19 NOTE — Telephone Encounter (Signed)
Refill request for Dr. Donnie Aho patient on Metoprolol Succinate. [facility], per Dr. Bing Matter

## 2018-11-24 ENCOUNTER — Encounter: Payer: Self-pay | Admitting: Obstetrics & Gynecology

## 2018-11-24 ENCOUNTER — Ambulatory Visit: Payer: BC Managed Care – PPO | Admitting: Obstetrics & Gynecology

## 2018-11-24 VITALS — BP 126/82 | Ht 64.0 in | Wt 172.4 lb

## 2018-11-24 DIAGNOSIS — Z01419 Encounter for gynecological examination (general) (routine) without abnormal findings: Secondary | ICD-10-CM | POA: Diagnosis not present

## 2018-11-24 DIAGNOSIS — Z3041 Encounter for surveillance of contraceptive pills: Secondary | ICD-10-CM | POA: Diagnosis not present

## 2018-11-24 DIAGNOSIS — E663 Overweight: Secondary | ICD-10-CM | POA: Diagnosis not present

## 2018-11-24 MED ORDER — DROSPIRENONE-ETHINYL ESTRADIOL 3-0.02 MG PO TABS: 1.0000 | ORAL_TABLET | Freq: Every day | ORAL | 4 refills | Status: DC

## 2018-11-24 NOTE — Progress Notes (Signed)
Rhonda Drake Nov 06, 1976 [id number]   History:    43 y.o. G2P2L2  RP:  Established patient presenting for annual gyn exam   HPI: Well on Loryna.  No BTB.  No pelvic pain.  No pain with IC.  Urine/BMs normal.  Breasts normal.  BMI 29.59.  Just started a low Calorie/Carb diet and a fitness program.  Health labs with Fam MD.  Past medical history,surgical history, family history and social history were all reviewed and documented in the EPIC chart.  Gynecologic History Patient's last menstrual period was 11/08/2017. Contraception: OCP (estrogen/progesterone) Last Pap: 2017.  Results were: Negative Last mammogram: 06/2018. Results were: Benign Bone Density: Never Colonoscopy: 2011 Admitted for Clostrodium Difficile  Obstetric History OB History  Gravida Para Term Preterm AB Living  2 2       2   SAB TAB Ectopic Multiple Live Births               # Outcome Date GA Lbr Len/2nd Weight Sex Delivery Anes PTL Lv  2 Para           1 Para              ROS: A ROS was performed and pertinent positives and negatives are included in the history.  GENERAL: No fevers or chills. HEENT: No change in vision, no earache, sore throat or sinus congestion. NECK: No pain or stiffness. CARDIOVASCULAR: No chest pain or pressure. No palpitations. PULMONARY: No shortness of breath, cough or wheeze. GASTROINTESTINAL: No abdominal pain, nausea, vomiting or diarrhea, melena or bright red blood per rectum. GENITOURINARY: No urinary frequency, urgency, hesitancy or dysuria. MUSCULOSKELETAL: No joint or muscle pain, no back pain, no recent trauma. DERMATOLOGIC: No rash, no itching, no lesions. ENDOCRINE: No polyuria, polydipsia, no heat or cold intolerance. No recent change in weight. HEMATOLOGICAL: No anemia or easy bruising or bleeding. NEUROLOGIC: No headache, seizures, numbness, tingling or weakness. PSYCHIATRIC: No depression, no loss of interest in normal activity or change in sleep pattern.      Exam:   BP 126/82   Ht 5\' 4"  (1.626 m)   Wt 172 lb 6.4 oz (78.2 kg)   LMP 11/08/2017 Comment: PILL  BMI 29.59 kg/m   Body mass index is 29.59 kg/m.  General appearance : Well developed well nourished female. No acute distress HEENT: Eyes: no retinal hemorrhage or exudates,  Neck supple, trachea midline, no carotid bruits, no thyroidmegaly Lungs: Clear to auscultation, no rhonchi or wheezes, or rib retractions  Heart: Regular rate and rhythm, no murmurs or gallops Breast:Examined in sitting and supine position were symmetrical in appearance, no palpable masses or tenderness,  no skin retraction, no nipple inversion, no nipple discharge, no skin discoloration, no axillary or supraclavicular lymphadenopathy Abdomen: no palpable masses or tenderness, no rebound or guarding Extremities: no edema or skin discoloration or tenderness  Pelvic: Vulva: Normal             Vagina: No gross lesions or discharge  Cervix: No gross lesions or discharge.  Pap reflex done  Uterus  AV, normal size, shape and consistency, non-tender and mobile  Adnexa  Without masses or tenderness  Anus: Normal   Assessment/Plan:  43 y.o. female for annual exam   1. Encounter for routine gynecological examination with Papanicolaou smear of cervix Normal gynecologic exam.  Pap reflex done.  Breast exam normal.  Last mammogram August 2019 was benign.  Had colonoscopy in 2011.  Health labs with family physician.  2. Encounter for surveillance of contraceptive pills Well on Loryna.  No contraindication to continue on birth control pills.  Loryna prescription sent to pharmacy.  3. Overweight (BMI 25.0-29.9) Recommend lower calorie/carb diet such as [employer].  Aerobic activities 5 times a week and weightlifting every 2 days.  Other orders - Probiotic Product (PROBIOTIC-10 PO); Take by mouth. - cetirizine (ZYRTEC) 10 MG tablet; Take 10 mg by mouth daily. - fluticasone (FLONASE) 50 MCG/ACT nasal spray;  Place into both nostrils daily. - mometasone (NASONEX) 50 MCG/ACT nasal spray; Place 2 sprays into the nose daily. - drospirenone-ethinyl estradiol (YAZ,GIANVI,LORYNA) 3-0.02 MG tablet; Take 1 tablet by mouth daily.  Genia Del MD, 4:04 PM 11/24/2018

## 2018-11-25 LAB — PAP IG W/ RFLX HPV ASCU

## 2018-11-29 ENCOUNTER — Encounter: Payer: Self-pay | Admitting: Obstetrics & Gynecology

## 2018-11-29 NOTE — Patient Instructions (Signed)
1. Encounter for routine gynecological examination with Papanicolaou smear of cervix Normal gynecologic exam.  Pap reflex done.  Breast exam normal.  Last mammogram August 2019 was benign.  Had colonoscopy in 2011.  Health labs with family physician.  2. Encounter for surveillance of contraceptive pills Well on Loryna.  No contraindication to continue on birth control pills.  Loryna prescription sent to pharmacy.  3. Overweight (BMI 25.0-29.9) Recommend lower calorie/carb diet such as [employer].  Aerobic activities 5 times a week and weightlifting every 2 days.  Other orders - Probiotic Product (PROBIOTIC-10 PO); Take by mouth. - cetirizine (ZYRTEC) 10 MG tablet; Take 10 mg by mouth daily. - fluticasone (FLONASE) 50 MCG/ACT nasal spray; Place into both nostrils daily. - mometasone (NASONEX) 50 MCG/ACT nasal spray; Place 2 sprays into the nose daily. - drospirenone-ethinyl estradiol (YAZ,GIANVI,LORYNA) 3-0.02 MG tablet; Take 1 tablet by mouth daily.  Rhonda Drake, it was a pleasure seeing you today!  I will inform you of your results as soon as they are available.

## 2018-12-28 ENCOUNTER — Other Ambulatory Visit: Payer: Self-pay | Admitting: Cardiology

## 2019-01-12 ENCOUNTER — Telehealth: Payer: Self-pay | Admitting: Nurse Practitioner

## 2019-01-12 NOTE — Telephone Encounter (Signed)
Pt calling requesting a refill on metoprolol. Pt has not been seen in our office before. Pt has an upcoming appt with Norma Fredrickson, NP on 02/07/19. Would Norma Fredrickson, NP like to refill this mediction? Please address

## 2019-01-12 NOTE — Telephone Encounter (Signed)
New Message     *STAT* If patient is at the pharmacy, call can be transferred to refill team.   1. Which medications need to be refilled? (please list name of each medication and dose if known) Metoprolol 25mg    2. Which pharmacy/location (including street and city if local pharmacy) is medication to be sent to? [facility] [address]   3. Do they need a 30 day or 90 day supply? 90 day

## 2019-01-12 NOTE — Telephone Encounter (Signed)
May have a one time #90 supply. She is a patient of Dr. York Spaniel.

## 2019-01-16 MED ORDER — METOPROLOL SUCCINATE ER 25 MG PO TB24: 25.0000 mg | ORAL_TABLET | Freq: Every day | ORAL | 0 refills | Status: DC

## 2019-01-16 NOTE — Telephone Encounter (Signed)
S/w pt is aware Toprol was filled today.  [facility] stated will fill for #30 until pt comes in for appt due to this pt being new to practice per Dr. Donnie Aho.  Also pt's appt has been changed to March 13 @ 3:00 pm instead of 3:30 due to pt being new and needs a 60 minute slot.

## 2019-01-27 ENCOUNTER — Encounter: Payer: Self-pay | Admitting: Nurse Practitioner

## 2019-02-01 ENCOUNTER — Other Ambulatory Visit: Payer: Self-pay | Admitting: Nurse Practitioner

## 2019-02-01 MED ORDER — METOPROLOL SUCCINATE ER 25 MG PO TB24: 25.0000 mg | ORAL_TABLET | Freq: Every day | ORAL | 1 refills | Status: DC

## 2019-02-01 NOTE — Progress Notes (Signed)
   Primary Cardiologist:  Prior [place] patient.   Patient contacted.  History reviewed.  No symptoms to suggest any unstable cardiac conditions.  Based on discussion, with current pandemic situation, we will be postponing this appointment for Rhonda Drake with a plan for f/u in June/July as a new patient to establish here with me or sooner if feasible/necessary. Her Toprol has been refilled today.  If symptoms change, she has been instructed to contact our office.    Norma Fredrickson, NP  02/01/2019 7:56 AM         .

## 2019-02-07 ENCOUNTER — Ambulatory Visit: Payer: BC Managed Care – PPO | Admitting: Nurse Practitioner

## 2019-05-04 ENCOUNTER — Telehealth: Payer: Self-pay

## 2019-05-04 NOTE — Telephone Encounter (Signed)
I called and left patient a message to call the office back. I called to over covid-19 screening questions and guidelines.

## 2019-05-05 NOTE — Telephone Encounter (Signed)
Left message on home and cell number to call office 

## 2019-05-05 NOTE — Telephone Encounter (Signed)
Follow up:    Patient retuning call back from yesterday concering ovid-19 screening.

## 2019-05-08 NOTE — Progress Notes (Signed)
CARDIOLOGY OFFICE NOTE  Date:  05/09/2019    Rhonda BoopJulie M Wedig Date of Birth: [birth year] Medical Record #[id number]  PCP:  Clayborn Heronankins, Victoria R, MD  Cardiologist:  Tyrone SageGerhardt     Chief Complaint  Patient presents with  . Palpitations    New patient visit - seen for Dr. Donnie Ahoilley    History of Present Illness: Rhonda Drake is a 43 y.o. female who presents today for a new patient visit - former patient of Dr. York Spanielilley's who has asked to follow with me.   She has a history of palpitations, IBS and HLD. Echo from 01/2018 with mild LVH and normal EF with trace MR, TR and pulmonic regurgitation. She had a negative GXT in 01/2018 for ischemia but with reduced exercise tolerance noted. She had a KOH monitor - showing NSR - with palpitations she had occasional episodes of tachycardia. No significant arrhythmia noted.   The patient does not have symptoms concerning for COVID-19 infection (fever, chills, cough, or new shortness of breath).   Comes in today. Here alone. 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 She is feeling good. Has gained weight from being home - not walking as much as she would if she would be in the classroom. No chest pain. Breathing is fine. No significant palpitations - has had maybe one spell - Toprol works real good for her. She has not had any recent labs - has not seen PCP in some time. She feels like she is doing well and has no real concerns.   Past Medical History:  Diagnosis Date  . Hyperlipidemia   . Irritable bowel syndrome   . Mild allergic rhinitis   . Palpitations   . Rapid heart beat     Past Surgical History:  Procedure Laterality Date  . CESAREAN SECTION     x2  . WISDOM TOOTH EXTRACTION       Medications: Current Meds  Medication Sig  . cetirizine (ZYRTEC) 10 MG tablet Take 10 mg by mouth daily.  . drospirenone-ethinyl estradiol (YAZ,GIANVI,LORYNA)  3-0.02 MG tablet Take 1 tablet by mouth daily.  . fluticasone (FLONASE) 50 MCG/ACT nasal spray Place into both nostrils daily.  . metoprolol succinate (TOPROL-XL) 25 MG 24 hr tablet Take 1 tablet (25 mg total) by mouth daily.  . mometasone (NASONEX) 50 MCG/ACT nasal spray Place 2 sprays into the nose daily.  . polyethylene glycol (MIRALAX) 17 g packet Miralax  . Probiotic Product (PROBIOTIC-10 PO) Take by mouth.  . [DISCONTINUED] metoprolol succinate (TOPROL-XL) 25 MG 24 hr tablet Take 1 tablet (25 mg total) by mouth daily.     Allergies: Allergies  Allergen Reactions  . Penicillins   . Sulfa Antibiotics Hives    Social History: The patient  reports that she has never smoked. She has never used smokeless tobacco. She reports current alcohol use. She reports that she does not use drugs.   Family History: The patient's family history includes Cancer in her father; Heart attack in her father; Heart disease in her father; Hypercholesterolemia in her father and mother; Hypertension in her father and mother.   Review of Systems: Please see the history of present illness.   All other systems are reviewed and negative.   Physical Exam: VS:  BP 116/80 (BP Location: Left Arm, Patient Position: Sitting, Cuff Size: Normal)   Pulse 86   Ht 5'  4" (1.626 m)   Wt 179 lb (81.2 kg)   BMI 30.73 kg/m  .  BMI Body mass index is 30.73 kg/m.  Wt Readings from Last 3 Encounters:  05/09/19 179 lb (81.2 kg)  11/24/18 172 lb 6.4 oz (78.2 kg)  11/23/17 168 lb (76.2 kg)    General: Pleasant. Well developed, well nourished and in no acute distress. She has gained weight.   HEENT: Normal.  Neck: Supple, no JVD, carotid bruits, or masses noted.  Cardiac: Regular rate and rhythm. No murmurs, rubs, or gallops. No edema.  Respiratory:  Lungs are clear to auscultation bilaterally with normal work of breathing.  GI: Soft and nontender.  MS: No deformity or atrophy. Gait and ROM intact.  Skin: Warm and  dry. Color is normal.  Neuro:  Strength and sensation are intact and no gross focal deficits noted.  Psych: Alert, appropriate and with normal affect.   LABORATORY DATA:  EKG:  EKG is ordered today. This demonstrates NSR.   Lab Results  Component Value Date   WBC 6.4 02/14/2010   HGB 11.7 (L) 02/14/2010   HCT 33.6 (L) 02/14/2010   PLT 159 02/14/2010   GLUCOSE 104 (H) 02/14/2010   ALT 16 02/14/2010   AST 15 02/14/2010   NA 139 02/14/2010   K 4.0 02/14/2010   CL 110 02/14/2010   CREATININE 0.56 02/14/2010   BUN 2 (L) 02/14/2010   CO2 25 02/14/2010   INR 1.01 02/13/2010     BNP (last 3 results) No results for input(s): BNP in the last 8760 hours.  ProBNP (last 3 results) No results for input(s): PROBNP in the last 8760 hours.   Other Studies Reviewed Today:  ECHO 01/2018     Assessment/Plan:  1. Palpitations - basically normal echo - negative past heart monitor - negative prior GXT - doing well with her current dose of Toprol - refilled this today.   2. IBS - not discussed  3. HLD - checking labs today  4. General health maintenance - getting labs today - she is going to try and work on diet/weight over the summer.   5. COVID-19 Education: The signs and symptoms of COVID-19 were discussed with the patient and how to seek care for testing (follow up with PCP or arrange E-visit).  The importance of social distancing, staying at home, hand hygiene and wearing a mask when out in public were discussed today.  Current medicines are reviewed with the patient today.  The patient does not have concerns regarding medicines other than what has been noted above.  The following changes have been made:  See above.  Labs/ tests ordered today include:    Orders Placed This Encounter  Procedures  . Basic metabolic panel  . CBC  . Hepatic function panel  . Lipid panel  . TSH  . EKG 12-Lead     Disposition:   FU with me in about one year.   Patient is agreeable to  this plan and will call if any problems develop in the interim.   SignedTruitt Merle, NP  05/09/2019 10:25 AM  [facility] [address] [place], [place]  [address] Phone: [phone] Fax: [phone]

## 2019-05-08 NOTE — Telephone Encounter (Signed)
    COVID-19 Pre-Screening Questions:  . In the past 7 to 10 days have you had a cough,  shortness of breath, headache, congestion, fever (100 or greater) body aches, chills, sore throat, or sudden loss of taste or sense of smell?-NO . Have you been around anyone with known Covid 19.-NO . Have you been around anyone who is awaiting Covid 19 test results in the past 7 to 10 days?-NO . Have you been around anyone who has been exposed to Covid 19, or has mentioned symptoms of Covid 19 within the past 7 to 10 days?-NO   Covid screening questions complete. Pt is aware to wear her facial mask throughout the entire OV.  Pt is aware of our no visitor restriction.  Pt is aware of her new pt appt with Truitt Merle NP tomorrow 6/30 at 0930.  Pt aware of the location of our office.

## 2019-05-09 ENCOUNTER — Other Ambulatory Visit: Payer: Self-pay

## 2019-05-09 ENCOUNTER — Encounter: Payer: Self-pay | Admitting: Nurse Practitioner

## 2019-05-09 ENCOUNTER — Ambulatory Visit (INDEPENDENT_AMBULATORY_CARE_PROVIDER_SITE_OTHER): Payer: BC Managed Care – PPO | Admitting: Nurse Practitioner

## 2019-05-09 VITALS — BP 116/80 | HR 86 | Ht 64.0 in | Wt 179.0 lb

## 2019-05-09 DIAGNOSIS — E7849 Other hyperlipidemia: Secondary | ICD-10-CM | POA: Diagnosis not present

## 2019-05-09 DIAGNOSIS — R002 Palpitations: Secondary | ICD-10-CM | POA: Diagnosis not present

## 2019-05-09 DIAGNOSIS — Z0000 Encounter for general adult medical examination without abnormal findings: Secondary | ICD-10-CM

## 2019-05-09 LAB — CBC
Hematocrit: 42.7 % (ref 34.0–46.6)
Hemoglobin: 14.6 g/dL (ref 11.1–15.9)
MCH: 29.7 pg (ref 26.6–33.0)
MCHC: 34.2 g/dL (ref 31.5–35.7)
MCV: 87 fL (ref 79–97)
Platelets: 228 10*3/uL (ref 150–450)
RBC: 4.91 x10E6/uL (ref 3.77–5.28)
RDW: 12.3 % (ref 11.7–15.4)
WBC: 7.6 10*3/uL (ref 3.4–10.8)

## 2019-05-09 LAB — BASIC METABOLIC PANEL
BUN/Creatinine Ratio: 10 (ref 9–23)
BUN: 8 mg/dL (ref 6–24)
CO2: 22 mmol/L (ref 20–29)
Calcium: 9.9 mg/dL (ref 8.7–10.2)
Chloride: 99 mmol/L (ref 96–106)
Creatinine, Ser: 0.84 mg/dL (ref 0.57–1.00)
GFR calc Af Amer: 98 mL/min/{1.73_m2} (ref >59)
GFR calc non Af Amer: 85 mL/min/{1.73_m2} (ref >59)
Glucose: 88 mg/dL (ref 65–99)
Potassium: 4.3 mmol/L (ref 3.5–5.2)
Sodium: 138 mmol/L (ref 134–144)

## 2019-05-09 LAB — HEPATIC FUNCTION PANEL
ALT: 17 IU/L (ref 0–32)
AST: 17 IU/L (ref 0–40)
Albumin: 4.6 g/dL (ref 3.8–4.8)
Alkaline Phosphatase: 58 IU/L (ref 39–117)
Bilirubin Total: 0.4 mg/dL (ref 0.0–1.2)
Bilirubin, Direct: 0.12 mg/dL (ref 0.00–0.40)
Total Protein: 6.8 g/dL (ref 6.0–8.5)

## 2019-05-09 LAB — TSH: TSH: 3.09 u[IU]/mL (ref 0.450–4.500)

## 2019-05-09 LAB — LIPID PANEL
Chol/HDL Ratio: 3.4 ratio (ref 0.0–4.4)
Cholesterol, Total: 270 mg/dL — ABNORMAL HIGH (ref 100–199)
HDL: 80 mg/dL (ref >39)
LDL Calculated: 132 mg/dL — ABNORMAL HIGH (ref 0–99)
Triglycerides: 292 mg/dL — ABNORMAL HIGH (ref 0–149)
VLDL Cholesterol Cal: 58 mg/dL — ABNORMAL HIGH (ref 5–40)

## 2019-05-09 MED ORDER — METOPROLOL SUCCINATE ER 25 MG PO TB24: 25.0000 mg | ORAL_TABLET | Freq: Every day | ORAL | 3 refills | Status: DC

## 2019-05-09 NOTE — Patient Instructions (Addendum)
After Visit Summary:  We will be checking the following labs today - BMET, CBC, HPF, Lipids and TSH   Medication Instructions:    Continue with your current medicines.   I sent in your refill today   If you need a refill on your cardiac medications before your next appointment, please call your pharmacy.     Testing/Procedures To Be Arranged:  N/A  Follow-Up:   See me in one year.     At [facility], you and your health needs are our priority.  As part of our continuing mission to provide you with exceptional heart care, we have created designated Provider Care Teams.  These Care Teams include your primary Cardiologist (physician) and Advanced Practice Providers (APPs -  Physician Assistants and Nurse Practitioners) who all work together to provide you with the care you need, when you need it.  Special Instructions:  . Stay safe, stay home, wash your hands for at least 20 seconds and wear a mask when out in public.  . It was good to talk with you today.    Call the [place] office at [phone] if you have any questions, problems or concerns.

## 2019-05-10 ENCOUNTER — Other Ambulatory Visit: Payer: Self-pay | Admitting: *Deleted

## 2019-05-10 ENCOUNTER — Telehealth: Payer: Self-pay | Admitting: Nurse Practitioner

## 2019-05-10 DIAGNOSIS — E7849 Other hyperlipidemia: Secondary | ICD-10-CM

## 2019-05-10 NOTE — Telephone Encounter (Signed)
New message ° ° °Patient is returning call for lab results. Please call. °

## 2019-05-10 NOTE — Telephone Encounter (Signed)
This note is for labs results on July 1.    Error was done and labs were taken out of results box.  S/w pt is aware of lab results. Pt is agreeable to treatment plan will come in on Sept 29 for repeat labs, lipid/lft.  Orders placed in system and linked, appt made.  Mailed calendar to pt with lab appt.

## 2019-06-20 ENCOUNTER — Encounter: Payer: Self-pay | Admitting: Obstetrics & Gynecology

## 2019-08-08 ENCOUNTER — Other Ambulatory Visit: Payer: BC Managed Care – PPO

## 2019-08-08 ENCOUNTER — Other Ambulatory Visit: Payer: Self-pay

## 2019-08-08 DIAGNOSIS — E7849 Other hyperlipidemia: Secondary | ICD-10-CM

## 2019-08-09 ENCOUNTER — Other Ambulatory Visit: Payer: Self-pay | Admitting: *Deleted

## 2019-08-09 DIAGNOSIS — E7849 Other hyperlipidemia: Secondary | ICD-10-CM

## 2019-08-09 LAB — LIPID PANEL
Chol/HDL Ratio: 3.4 ratio (ref 0.0–4.4)
Cholesterol, Total: 251 mg/dL — ABNORMAL HIGH (ref 100–199)
HDL: 74 mg/dL (ref >39)
LDL Chol Calc (NIH): 152 mg/dL — ABNORMAL HIGH (ref 0–99)
Triglycerides: 144 mg/dL (ref 0–149)
VLDL Cholesterol Cal: 25 mg/dL (ref 5–40)

## 2019-08-09 LAB — HEPATIC FUNCTION PANEL
ALT: 31 IU/L (ref 0–32)
AST: 24 IU/L (ref 0–40)
Albumin: 4.6 g/dL (ref 3.8–4.8)
Alkaline Phosphatase: 73 IU/L (ref 39–117)
Bilirubin Total: 0.5 mg/dL (ref 0.0–1.2)
Bilirubin, Direct: 0.15 mg/dL (ref 0.00–0.40)
Total Protein: 6.8 g/dL (ref 6.0–8.5)

## 2019-08-09 MED ORDER — ATORVASTATIN CALCIUM 10 MG PO TABS: 10.0000 mg | ORAL_TABLET | Freq: Every day | ORAL | 3 refills | Status: DC

## 2019-09-20 ENCOUNTER — Other Ambulatory Visit: Payer: Self-pay

## 2019-09-20 ENCOUNTER — Other Ambulatory Visit: Payer: BC Managed Care – PPO | Admitting: *Deleted

## 2019-09-20 DIAGNOSIS — E7849 Other hyperlipidemia: Secondary | ICD-10-CM

## 2019-09-20 LAB — LIPID PANEL
Chol/HDL Ratio: 2.6 ratio (ref 0.0–4.4)
Cholesterol, Total: 219 mg/dL — ABNORMAL HIGH (ref 100–199)
HDL: 85 mg/dL (ref >39)
LDL Chol Calc (NIH): 111 mg/dL — ABNORMAL HIGH (ref 0–99)
Triglycerides: 136 mg/dL (ref 0–149)
VLDL Cholesterol Cal: 23 mg/dL (ref 5–40)

## 2019-09-20 LAB — HEPATIC FUNCTION PANEL
ALT: 21 IU/L (ref 0–32)
AST: 14 IU/L (ref 0–40)
Albumin: 4.5 g/dL (ref 3.8–4.8)
Alkaline Phosphatase: 68 IU/L (ref 39–117)
Bilirubin Total: 0.6 mg/dL (ref 0.0–1.2)
Bilirubin, Direct: 0.17 mg/dL (ref 0.00–0.40)
Total Protein: 7 g/dL (ref 6.0–8.5)

## 2019-09-21 ENCOUNTER — Telehealth: Payer: Self-pay

## 2019-09-21 DIAGNOSIS — E7849 Other hyperlipidemia: Secondary | ICD-10-CM

## 2019-09-21 DIAGNOSIS — Z0000 Encounter for general adult medical examination without abnormal findings: Secondary | ICD-10-CM

## 2019-09-21 NOTE — Telephone Encounter (Signed)
-----   Message from Burtis Junes, NP sent at 09/21/2019  7:03 AM EST ----- Ok to report. Lipids look better - LDL has dropped 40 points. Liver tests are ok. Recheck lipids and LFTs in 6 months. Continue with diet/exercise/weight loss.

## 2019-09-21 NOTE — Telephone Encounter (Signed)
Pt verbalized understanding of her lab results and will return 03/23/19 for fasting labs.

## 2019-11-27 ENCOUNTER — Other Ambulatory Visit: Payer: Self-pay

## 2019-11-28 ENCOUNTER — Encounter: Payer: Self-pay | Admitting: Obstetrics & Gynecology

## 2019-11-28 ENCOUNTER — Ambulatory Visit: Payer: BC Managed Care – PPO | Admitting: Obstetrics & Gynecology

## 2019-11-28 VITALS — BP 130/80 | Ht 64.0 in | Wt 179.0 lb

## 2019-11-28 DIAGNOSIS — Z6830 Body mass index (BMI) 30.0-30.9, adult: Secondary | ICD-10-CM

## 2019-11-28 DIAGNOSIS — Z3041 Encounter for surveillance of contraceptive pills: Secondary | ICD-10-CM | POA: Diagnosis not present

## 2019-11-28 DIAGNOSIS — E6609 Other obesity due to excess calories: Secondary | ICD-10-CM | POA: Diagnosis not present

## 2019-11-28 DIAGNOSIS — Z01419 Encounter for gynecological examination (general) (routine) without abnormal findings: Secondary | ICD-10-CM | POA: Diagnosis not present

## 2019-11-28 MED ORDER — DROSPIRENONE-ETHINYL ESTRADIOL 3-0.02 MG PO TABS: 1.0000 | ORAL_TABLET | Freq: Every day | ORAL | 4 refills | Status: DC

## 2019-11-28 NOTE — Progress Notes (Signed)
Rhonda Drake July 31, 1976 [id number]   History:    44 y.o. 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  RP:  Established patient presenting for annual gyn exam   HPI: Well on [place].  No BTB.  No pelvic pain.  No pain with IC.  Urine/BMs normal.  Breasts normal.  BMI 30.73.  Just started a low Calorie/Carb diet and a fitness program.  Health labs with Fam MD.  Past medical history,surgical history, family history and social history were all reviewed and documented in the EPIC chart.  Gynecologic History No LMP recorded. (Menstrual status: Oral contraceptives).  Obstetric History OB History  Gravida Para Term Preterm AB Living  2 2       2   SAB TAB Ectopic Multiple Live Births               # Outcome Date GA Lbr Len/2nd Weight Sex Delivery Anes PTL Lv  2 Para           1 Para              ROS: A ROS was performed and pertinent positives and negatives are included in the history.  GENERAL: No fevers or chills. HEENT: No change in vision, no earache, sore throat or sinus congestion. NECK: No pain or stiffness. CARDIOVASCULAR: No chest pain or pressure. No palpitations. PULMONARY: No shortness of breath, cough or wheeze. GASTROINTESTINAL: No abdominal pain, nausea, vomiting or diarrhea, melena or bright red blood per rectum. GENITOURINARY: No urinary frequency, urgency, hesitancy or dysuria. MUSCULOSKELETAL: No joint or muscle pain, no back pain, no recent trauma. DERMATOLOGIC: No rash, no itching, no lesions. ENDOCRINE: No polyuria, polydipsia, no heat or cold intolerance. No recent change in weight. HEMATOLOGICAL: No anemia or easy bruising or bleeding. NEUROLOGIC: No headache, seizures, numbness, tingling or weakness. PSYCHIATRIC: No depression, no loss of interest in normal activity or change in sleep pattern.     Exam:   BP 130/80   Ht 5\' 4"  (1.626 m)   Wt 179 lb (81.2 kg)   BMI 30.73 kg/m   Body mass index is 30.73 kg/m.  General  appearance : Well developed well nourished female. No acute distress HEENT: Eyes: no retinal hemorrhage or exudates,  Neck supple, trachea midline, no carotid bruits, no thyroidmegaly Lungs: Clear to auscultation, no rhonchi or wheezes, or rib retractions  Heart: Regular rate and rhythm, no murmurs or gallops Breast:Examined in sitting and supine position were symmetrical in appearance, no palpable masses or tenderness,  no skin retraction, no nipple inversion, no nipple discharge, no skin discoloration, no axillary or supraclavicular lymphadenopathy Abdomen: no palpable masses or tenderness, no rebound or guarding Extremities: no edema or skin discoloration or tenderness  Pelvic: Vulva: Normal             Vagina: No gross lesions or discharge  Cervix: No gross lesions or discharge  Uterus  AV, normal size, shape and consistency, non-tender and mobile  Adnexa  Without masses or tenderness  Anus: Normal   Assessment/Plan:  44 y.o. female for annual exam   1. Well female exam with routine gynecological exam Normal gynecologic exam.  Pap test in January 2020 was negative, no indication to repeat this year.  Breast exam normal.  Screening mammogram August 2020 was negative.  Health labs with family physician.  2. Encounter for surveillance of contraceptive pills Well on birth control pills with Loryna.  No contraindication to continue.  Prescription sent  to pharmacy.  3. Class 1 obesity due to excess calories with serious comorbidity and body mass index (BMI) of 30.0 to 30.9 in adult Continue with low calorie/carb diet.  Aerobic physical activities 5 times a week and light weightlifting every 2 days recommended.  Other orders - drospirenone-ethinyl estradiol (YAZ) 3-0.02 MG tablet; Take 1 tablet by mouth daily.  Genia Del MD, 3:54 PM 11/28/2019

## 2019-12-03 ENCOUNTER — Encounter: Payer: Self-pay | Admitting: Obstetrics & Gynecology

## 2019-12-03 NOTE — Patient Instructions (Signed)
1. Well female exam with routine gynecological exam Normal gynecologic exam.  Pap test in January 2020 was negative, no indication to repeat this year.  Breast exam normal.  Screening mammogram August 2020 was negative.  Health labs with family physician.  2. Encounter for surveillance of contraceptive pills Well on birth control pills with Loryna.  No contraindication to continue.  Prescription sent to pharmacy.  3. Class 1 obesity due to excess calories with serious comorbidity and body mass index (BMI) of 30.0 to 30.9 in adult Continue with low calorie/carb diet.  Aerobic physical activities 5 times a week and light weightlifting every 2 days recommended.  Other orders - drospirenone-ethinyl estradiol (YAZ) 3-0.02 MG tablet; Take 1 tablet by mouth daily.  Rhonda Drake, it was a pleasure seeing you today!

## 2020-03-22 ENCOUNTER — Other Ambulatory Visit: Payer: Self-pay

## 2020-03-22 ENCOUNTER — Other Ambulatory Visit: Payer: BC Managed Care – PPO | Admitting: *Deleted

## 2020-03-22 DIAGNOSIS — E7849 Other hyperlipidemia: Secondary | ICD-10-CM

## 2020-03-22 DIAGNOSIS — Z0000 Encounter for general adult medical examination without abnormal findings: Secondary | ICD-10-CM

## 2020-03-22 LAB — HEPATIC FUNCTION PANEL
ALT: 26 IU/L (ref 0–32)
AST: 16 IU/L (ref 0–40)
Albumin: 4.3 g/dL (ref 3.8–4.8)
Alkaline Phosphatase: 69 IU/L (ref 39–117)
Bilirubin Total: 0.6 mg/dL (ref 0.0–1.2)
Bilirubin, Direct: 0.15 mg/dL (ref 0.00–0.40)
Total Protein: 6.7 g/dL (ref 6.0–8.5)

## 2020-03-22 LAB — LIPID PANEL
Chol/HDL Ratio: 2.6 ratio (ref 0.0–4.4)
Cholesterol, Total: 200 mg/dL — ABNORMAL HIGH (ref 100–199)
HDL: 76 mg/dL (ref >39)
LDL Chol Calc (NIH): 100 mg/dL — ABNORMAL HIGH (ref 0–99)
Triglycerides: 143 mg/dL (ref 0–149)
VLDL Cholesterol Cal: 24 mg/dL (ref 5–40)

## 2020-04-10 NOTE — Progress Notes (Signed)
CARDIOLOGY OFFICE NOTE  Date:  04/24/2020    Rhonda Drake Date of Birth: [birth year] Medical Record #[id number]  PCP:  Clayborn Heron, MD  Cardiologist:  Tyrone Sage   Chief Complaint  Patient presents with  . Follow-up    History of Present Illness: Rhonda Drake is a 44 y.o. female who presents today for a one year check. She is a  former patient of Dr. York Spaniel who has asked to follow with me.   She has a history of palpitations, IBS and HLD. Echo from 01/2018 with mild LVH and normal EF with trace MR, TR and pulmonic regurgitation. She had a negative GXT in 01/2018 for ischemia but with reduced exercise tolerance noted. She had a KOH monitor - showing NSR - with palpitations she had occasional episodes of tachycardia. No significant arrhythmia noted.   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 Was doing well - had gained weight from staying at home. No significant palpitations reported.   The patient does not have symptoms concerning for COVID-19 infection (fever, chills, cough, or new shortness of breath).   Comes in today. Here alone. Heart rate has been variable. She notes readings in the 90's at times. She was under the impression that her heart rate needed to be in the 80's.  She had a "scare" yesterday - was relaxed - had had lunch - had sharp headache - could not read the words on her book for a minute or so - then headache got worse - her right side of her face got numb - gone in less than 5 minutes - then had a migraine. Took Advil. She had no trouble with her speech/no mouth droop - she went and looked in a mirror.  Better today. No chest pain. BP is good. Not short of breath.   Past Medical History:  Diagnosis Date  . Arthritis   . Hyperlipidemia   . Irritable bowel syndrome   . Mild allergic rhinitis   . Palpitations   . Rapid heart beat     Past Surgical History:  Procedure Laterality Date  .  CESAREAN SECTION     x2  . WISDOM TOOTH EXTRACTION       Medications: Current Meds  Medication Sig  . cetirizine (ZYRTEC) 10 MG tablet Take 10 mg by mouth daily.  . fluticasone (FLONASE) 50 MCG/ACT nasal spray Place into both nostrils daily.  Marland Kitchen ibuprofen (ADVIL) 800 MG tablet ibuprofen 800 mg tablet  . Lactobacillus-Inulin (CULTURELLE DIGESTIVE HEALTH) CAPS Culturelle Digestive Health 10 billion cell-200 mg capsule  . metoprolol succinate (TOPROL-XL) 25 MG 24 hr tablet Take 1 tablet (25 mg total) by mouth daily.  . mometasone (NASONEX) 50 MCG/ACT nasal spray Place 2 sprays into the nose daily.  . Olopatadine HCl 0.6 % SOLN olopatadine 0.6 % nasal spray  USE 2 SPRAYS IN EACH NOSTRIL TWICE A DAY  . polyethylene glycol (MIRALAX) 17 g packet Miralax  . Probiotic Product (PROBIOTIC-10 PO) Take by mouth.  . RESTASIS 0.05 % ophthalmic emulsion 1 drop 2 (two) times daily.  . traMADol (ULTRAM) 50 MG tablet   . [DISCONTINUED] metoprolol succinate (TOPROL-XL) 25 MG 24 hr tablet Take 1 tablet (25 mg total) by mouth daily.     Allergies: Allergies  Allergen Reactions  . Penicillins   . Sulfa Antibiotics Hives    Social History: The patient  reports that she has never  smoked. She has never used smokeless tobacco. She reports current alcohol use. She reports that she does not use drugs.   Family History: The patient's family history includes Cancer in her father; Heart attack in her father; Heart disease in her father; Hypercholesterolemia in her father and mother; Hypertension in her father and mother.   Review of Systems: Please see the history of present illness.   All other systems are reviewed and negative.   Physical Exam: VS:  BP 120/84   Pulse 78   Ht 5\' 4"  (1.626 m)   Wt 177 lb 12.8 oz (80.6 kg)   SpO2 98%   BMI 30.52 kg/m  .  BMI Body mass index is 30.52 kg/m.  Wt Readings from Last 3 Encounters:  04/24/20 177 lb 12.8 oz (80.6 kg)  11/28/19 179 lb (81.2 kg)  05/09/19  179 lb (81.2 kg)    General: Pleasant. She is obese. She is alert and in no acute distress.   Cardiac: Regular rate and rhythm. No murmurs, rubs, or gallops. No edema.  Respiratory:  Lungs are clear to auscultation bilaterally with normal work of breathing.  GI: Soft and nontender.  MS: No deformity or atrophy. Gait and ROM intact.  Skin: Warm and dry. Color is normal.  Neuro:  Strength and sensation are intact and no gross focal deficits noted.  Psych: Alert, appropriate and with normal affect.   LABORATORY DATA:  EKG:  EKG is ordered today.  Personally reviewed by me. This demonstrates NSR - HR is 71 - tracing is unchanged.   Lab Results  Component Value Date   WBC 7.6 05/09/2019   HGB 14.6 05/09/2019   HCT 42.7 05/09/2019   PLT 228 05/09/2019   GLUCOSE 88 05/09/2019   CHOL 200 (H) 03/22/2020   TRIG 143 03/22/2020   HDL 76 03/22/2020   LDLCALC 100 (H) 03/22/2020   ALT 26 03/22/2020   AST 16 03/22/2020   NA 138 05/09/2019   K 4.3 05/09/2019   CL 99 05/09/2019   CREATININE 0.84 05/09/2019   BUN 8 05/09/2019   CO2 22 05/09/2019   TSH 3.090 05/09/2019   INR 1.01 02/13/2010     BNP (last 3 results) No results for input(s): BNP in the last 8760 hours.  ProBNP (last 3 results) No results for input(s): PROBNP in the last 8760 hours.   Other Studies Reviewed Today:  ECHO 01/2018     Assessment/Plan:  1. Spell of facial numbness/headache - sounds like migraine with aura - she has never had a spell like this - will arrange for CT of the head without contrast - I have asked her to touch base with her PCP as well for follow up. She does have a remote history of migraines - none in a long time - yesterday's spell very frightening to her.   2. Palpitations - seems stable - reassured that HR is ok - normal is 60 to 100 - she would like to stay on her current dose of Toprol and we will not increase this - I think as she gets back to walking this will improve. HR is fine  on EKG today. Her watch may not accurately correlate.   3. HLD - now on statin - lab from May with improvement.   Current medicines are reviewed with the patient today.  The patient does not have concerns regarding medicines other than what has been noted above.  The following changes have been made:  See above.  Labs/ tests ordered today include:    Orders Placed This Encounter  Procedures  . CT Head Wo Contrast  . Basic metabolic panel  . CBC  . TSH  . EKG 12-Lead     Disposition:   FU with [place] in one year. We will check lab today. Arrange for CT of the head. Continue Toprol. Asked her to follow up with PCP also.   Patient is agreeable to this plan and will call if any problems develop in the interim.   SignedTruitt Merle, NP  04/24/2020 10:12 AM  [facility] [address] [place],   [address] Phone: [phone] Fax: [phone]

## 2020-04-24 ENCOUNTER — Encounter: Payer: Self-pay | Admitting: Nurse Practitioner

## 2020-04-24 ENCOUNTER — Other Ambulatory Visit: Payer: Self-pay

## 2020-04-24 ENCOUNTER — Ambulatory Visit: Payer: BC Managed Care – PPO | Admitting: Nurse Practitioner

## 2020-04-24 VITALS — BP 120/84 | HR 78 | Ht 64.0 in | Wt 177.8 lb

## 2020-04-24 DIAGNOSIS — E7849 Other hyperlipidemia: Secondary | ICD-10-CM | POA: Diagnosis not present

## 2020-04-24 DIAGNOSIS — R200 Anesthesia of skin: Secondary | ICD-10-CM

## 2020-04-24 DIAGNOSIS — G4489 Other headache syndrome: Secondary | ICD-10-CM | POA: Diagnosis not present

## 2020-04-24 DIAGNOSIS — R002 Palpitations: Secondary | ICD-10-CM

## 2020-04-24 DIAGNOSIS — Z0000 Encounter for general adult medical examination without abnormal findings: Secondary | ICD-10-CM | POA: Diagnosis not present

## 2020-04-24 LAB — CBC
Hematocrit: 43.3 % (ref 34.0–46.6)
Hemoglobin: 14.6 g/dL (ref 11.1–15.9)
MCH: 29.3 pg (ref 26.6–33.0)
MCHC: 33.7 g/dL (ref 31.5–35.7)
MCV: 87 fL (ref 79–97)
Platelets: 244 10*3/uL (ref 150–450)
RBC: 4.98 x10E6/uL (ref 3.77–5.28)
RDW: 12.6 % (ref 11.7–15.4)
WBC: 9.1 10*3/uL (ref 3.4–10.8)

## 2020-04-24 LAB — BASIC METABOLIC PANEL
BUN/Creatinine Ratio: 12 (ref 9–23)
BUN: 10 mg/dL (ref 6–24)
CO2: 22 mmol/L (ref 20–29)
Calcium: 9.9 mg/dL (ref 8.7–10.2)
Chloride: 102 mmol/L (ref 96–106)
Creatinine, Ser: 0.84 mg/dL (ref 0.57–1.00)
GFR calc Af Amer: 98 mL/min/{1.73_m2} (ref >59)
GFR calc non Af Amer: 85 mL/min/{1.73_m2} (ref >59)
Glucose: 89 mg/dL (ref 65–99)
Potassium: 4.2 mmol/L (ref 3.5–5.2)
Sodium: 137 mmol/L (ref 134–144)

## 2020-04-24 LAB — TSH: TSH: 2.46 u[IU]/mL (ref 0.450–4.500)

## 2020-04-24 MED ORDER — METOPROLOL SUCCINATE ER 25 MG PO TB24: 25.0000 mg | ORAL_TABLET | Freq: Every day | ORAL | 3 refills | Status: DC

## 2020-04-24 MED ORDER — ATORVASTATIN CALCIUM 10 MG PO TABS: 10.0000 mg | ORAL_TABLET | Freq: Every day | ORAL | 3 refills | Status: DC

## 2020-04-24 NOTE — Patient Instructions (Addendum)
After Visit Summary:  We will be checking the following labs today - BMET, CBC and TSH   Medication Instructions:    Continue with your current medicines.  I refilled the Toprol and the Lipitor today    If you need a refill on your cardiac medications before your next appointment, please call your pharmacy.     Testing/Procedures To Be Arranged:  CT without contrast of the head  Follow-Up:   See me in one year -  You will receive a reminder letter in the mail two months in advance. If you don't receive a letter, please call our office to schedule the follow-up appointment.     At [facility], you and your health needs are our priority.  As part of our continuing mission to provide you with exceptional heart care, we have created designated Provider Care Teams.  These Care Teams include your primary Cardiologist (physician) and Advanced Practice Providers (APPs -  Physician Assistants and Nurse Practitioners) who all work together to provide you with the care you need, when you need it.  Special Instructions:  . Stay safe, wash your hands for at least 20 seconds and wear a mask when needed.  . It was good to talk with you today.  Marland Kitchen Touch base with Dr. Barbaraann Barthel about the headache - I will send her a copy of your scan.    Call the [facility] office at [phone] if you have any questions, problems or concerns.

## 2020-05-01 ENCOUNTER — Ambulatory Visit (INDEPENDENT_AMBULATORY_CARE_PROVIDER_SITE_OTHER)
Admission: RE | Admit: 2020-05-01 | Discharge: 2020-05-01 | Disposition: A | Discharge status: Home / Self Care | Payer: BC Managed Care – PPO | Source: Ambulatory Visit | Attending: Nurse Practitioner | Admitting: Nurse Practitioner

## 2020-05-01 ENCOUNTER — Other Ambulatory Visit: Payer: Self-pay

## 2020-05-01 DIAGNOSIS — G4489 Other headache syndrome: Secondary | ICD-10-CM

## 2020-05-01 DIAGNOSIS — R200 Anesthesia of skin: Secondary | ICD-10-CM

## 2020-05-03 ENCOUNTER — Telehealth: Payer: Self-pay

## 2020-05-03 NOTE — Telephone Encounter (Signed)
Pt returned my call. The patient has been notified of the result and verbalized understanding.  All questions (if any) were answered. Leanord Hawking, RN 05/03/2020 8:50 AM

## 2020-05-03 NOTE — Telephone Encounter (Signed)
LMTCB regarding CT results.  

## 2020-05-03 NOTE — Telephone Encounter (Signed)
-----   Message from Rosalio Macadamia, NP sent at 05/03/2020  8:37 AM EDT ----- Please call - Duwayne Heck is off today.  Her CT looks fine. Nothing bad noted - this is great news.  Copy to her PCP.

## 2020-05-03 NOTE — Telephone Encounter (Signed)
-----   Message from Lori C Gerhardt, NP sent at 05/03/2020  8:37 AM EDT ----- °Please call - Danielle is off today.  °Her CT looks fine. Nothing bad noted - this is great news.  °Copy to her PCP.  °

## 2020-05-07 ENCOUNTER — Ambulatory Visit: Payer: BC Managed Care – PPO | Admitting: Nurse Practitioner

## 2020-06-20 ENCOUNTER — Encounter: Payer: Self-pay | Admitting: Obstetrics & Gynecology

## 2020-11-28 ENCOUNTER — Ambulatory Visit: Payer: BC Managed Care – PPO | Admitting: Obstetrics & Gynecology

## 2020-11-28 ENCOUNTER — Other Ambulatory Visit: Payer: Self-pay

## 2020-11-28 ENCOUNTER — Encounter: Payer: Self-pay | Admitting: Obstetrics & Gynecology

## 2020-11-28 VITALS — BP 122/78 | Ht 64.0 in | Wt 170.0 lb

## 2020-11-28 DIAGNOSIS — E663 Overweight: Secondary | ICD-10-CM | POA: Diagnosis not present

## 2020-11-28 DIAGNOSIS — Z01419 Encounter for gynecological examination (general) (routine) without abnormal findings: Secondary | ICD-10-CM | POA: Diagnosis not present

## 2020-11-28 DIAGNOSIS — Z3041 Encounter for surveillance of contraceptive pills: Secondary | ICD-10-CM

## 2020-11-28 MED ORDER — NIKKI 3-0.02 MG PO TABS: 1.0000 | ORAL_TABLET | Freq: Every day | ORAL | 4 refills | Status: DC

## 2020-11-28 NOTE — Progress Notes (Signed)
Rhonda Drake 04-17-1976 [id number]   History:    45 y.o. G2P2L2  Married.  Tourist information centre manager.  Sons in Middle school.  TF:[id number] presenting for annual gyn exam   GBT:DVVO on [place]. No BTB. No pelvic pain. No pain with IC. Urine/BMs normal. Breasts normal.  Screening mammo neg 06/2020. BMI improved to 29.18. On a low Calorie/Carb diet and a fitness program. Health labs with Fam MD.  Past medical history,surgical history, family history and social history were all reviewed and documented in the EPIC chart.  Gynecologic History No LMP recorded. (Menstrual status: Oral contraceptives).  Obstetric History OB History  Gravida Para Term Preterm AB Living  2 2       2   SAB IAB Ectopic Multiple Live Births               # Outcome Date GA Lbr Len/2nd Weight Sex Delivery Anes PTL Lv  2 Para           1 Para              ROS: A ROS was performed and pertinent positives and negatives are included in the history.  GENERAL: No fevers or chills. HEENT: No change in vision, no earache, sore throat or sinus congestion. NECK: No pain or stiffness. CARDIOVASCULAR: No chest pain or pressure. No palpitations. PULMONARY: No shortness of breath, cough or wheeze. GASTROINTESTINAL: No abdominal pain, nausea, vomiting or diarrhea, melena or bright red blood per rectum. GENITOURINARY: No urinary frequency, urgency, hesitancy or dysuria. MUSCULOSKELETAL: No joint or muscle pain, no back pain, no recent trauma. DERMATOLOGIC: No rash, no itching, no lesions. ENDOCRINE: No polyuria, polydipsia, no heat or cold intolerance. No recent change in weight. HEMATOLOGICAL: No anemia or easy bruising or bleeding. NEUROLOGIC: No headache, seizures, numbness, tingling or weakness. PSYCHIATRIC: No depression, no loss of interest in normal activity or change in sleep pattern.     Exam:   BP 122/78 (BP Location: Right Arm, Patient Position: Sitting, Cuff Size: Normal)   Ht 5\' 4"   (1.626 m)   Wt 170 lb (77.1 kg)   BMI 29.18 kg/m   Body mass index is 29.18 kg/m.  General appearance : Well developed well nourished female. No acute distress HEENT: Eyes: no retinal hemorrhage or exudates,  Neck supple, trachea midline, no carotid bruits, no thyroidmegaly Lungs: Clear to auscultation, no rhonchi or wheezes, or rib retractions  Heart: Regular rate and rhythm, no murmurs or gallops Breast:Examined in sitting and supine position were symmetrical in appearance, no palpable masses or tenderness,  no skin retraction, no nipple inversion, no nipple discharge, no skin discoloration, no axillary or supraclavicular lymphadenopathy Abdomen: no palpable masses or tenderness, no rebound or guarding Extremities: no edema or skin discoloration or tenderness  Pelvic: Vulva: Normal             Vagina: No gross lesions or discharge  Cervix: No gross lesions or discharge.  Pap/HPV HR done.  Uterus  AV, normal size, shape and consistency, non-tender and mobile  Adnexa  Without masses or tenderness  Anus: Normal   Assessment/Plan:  45 y.o. female for annual exam   1. Encounter for routine gynecological examination with Papanicolaou smear of cervix Normal gynecologic exam.  Pap/HPV HR done.  Breast exam normal.  Screening mammogram August 2021 was negative.  Health labs with family physician.  2. Encounter for surveillance of contraceptive pills Well on [place].  No contraindication to continue.  Prescription sent to pharmacy.  3. Overweight (BMI 25.0-29.9) Recommend a slightly lower calorie/carb diet.  Aerobic activities 5 times a week and light weightlifting every 2 days.  Other orders - [name] 3-0.02 MG tablet; Take 1 tablet by mouth daily.  Genia Del MD, 1:56 PM 11/28/2020

## 2020-12-02 LAB — PAP IG W/ RFLX HPV ASCU

## 2020-12-02 LAB — HUMAN PAPILLOMAVIRUS, HIGH RISK: HPV DNA High Risk: NOT DETECTED

## 2020-12-04 ENCOUNTER — Encounter: Payer: Self-pay | Admitting: Obstetrics & Gynecology

## 2020-12-19 ENCOUNTER — Other Ambulatory Visit: Payer: Self-pay | Admitting: Obstetrics & Gynecology

## 2021-04-30 ENCOUNTER — Other Ambulatory Visit: Payer: Self-pay | Admitting: *Deleted

## 2021-04-30 MED ORDER — METOPROLOL SUCCINATE ER 25 MG PO TB24: 25.0000 mg | ORAL_TABLET | Freq: Every day | ORAL | 0 refills | Status: DC

## 2021-04-30 MED ORDER — ATORVASTATIN CALCIUM 10 MG PO TABS: 10.0000 mg | ORAL_TABLET | Freq: Every day | ORAL | 0 refills | Status: DC

## 2021-05-28 ENCOUNTER — Other Ambulatory Visit: Payer: Self-pay | Admitting: Physician Assistant

## 2021-08-06 NOTE — Progress Notes (Signed)
Cardiology Office Note    Date:  08/13/2021   ID:  Rhonda Drake, DOB November 21, 1975, MRN [id number]   PCP:  Clayborn Heron, MD   [facility]  Cardiologist:  None   Advanced Practice Provider:  No care team member to display Electrophysiologist:  None   [id number]   Chief Complaint  Patient presents with   Palpitations   Follow-up     History of Present Illness:  Rhonda Drake is a 45 y.o. female with a history of palpitations, IBS and HLD. Echo from 01/2018 with mild LVH and normal EF with trace MR, TR and pulmonic regurgitation. She had a negative GXT in 01/2018 for ischemia but with reduced exercise tolerance noted. She had a ZIO monitor - showing NSR - with palpitations she had occasional episodes of tachycardia. No significant arrhythmia noted.   Patient last seen 04/2020 and doing well except for migraine. Head CT ok.  Patient comes in for f/u. Complains of rapid heart beat. Walking to the car it was 87/m and when she gets in and sits down 120/m. No regularity. Wakes her up at night 150/m. Not related to drinking alcohol. Drinks 1 cup of 1/2 caffeine coffee. Works as a Runner, broadcasting/film/video. Was doing beach body work outs and feels it racing but doesn't get short of breath or have to stop. Worse at rest.Happens once a week and can last for hours. No chest pain, dyspnea, dizziness or presyncope. Father MI 79 and 49, died bone cancer 51-nonsmoker and not diabetic.  Past Medical History:  Diagnosis Date   Arthritis    Hyperlipidemia    Irritable bowel syndrome    Mild allergic rhinitis    Palpitations    Rapid heart beat     Past Surgical History:  Procedure Laterality Date   CESAREAN SECTION     x2   WISDOM TOOTH EXTRACTION      Current Medications: Current Meds  Medication Sig   atorvastatin (LIPITOR) 10 MG tablet Take 1 tablet (10 mg total) by mouth daily. Pt needs to keep upcoming appt in Oct for further refills   cetirizine (ZYRTEC) 10 MG  tablet Take 10 mg by mouth daily.   fluticasone (FLONASE) 50 MCG/ACT nasal spray Place into both nostrils daily.   mometasone (NASONEX) 50 MCG/ACT nasal spray Place 2 sprays into the nose daily.   [name] 3-0.02 MG tablet Take 1 tablet by mouth daily.   Olopatadine HCl 0.6 % SOLN olopatadine 0.6 % nasal spray  USE 2 SPRAYS IN EACH NOSTRIL TWICE A DAY   Probiotic Product (PROBIOTIC-10 PO) Take by mouth.   RESTASIS 0.05 % ophthalmic emulsion 1 drop 2 (two) times daily.   [DISCONTINUED] metoprolol succinate (TOPROL-XL) 25 MG 24 hr tablet Take 1 tablet (25 mg total) by mouth daily. Pt needs to keep upcoming appt in oct for further refills     Allergies:   Penicillins and Sulfa antibiotics   Social History   Socioeconomic History   Marital status: Married    Spouse name: Not on file   Number of children: 2   Years of education: Not on file   Highest education level: Not on file  Occupational History   Occupation: ART TEACHER  Tobacco Use   Smoking status: Never   Smokeless tobacco: Never  Vaping Use   Vaping Use: Never used  Substance and Sexual Activity   Alcohol use: Yes    Comment: social    Drug use: Never  Sexual activity: Yes    Partners: Male    Birth control/protection: Pill    Comment: 1st intercourse- 22, partners- 5, married- 14 yrs   Other Topics Concern   Not on file  Social History Narrative   Not on file   Social Determinants of Health   Financial Resource Strain: Not on file  Food Insecurity: Not on file  Transportation Needs: Not on file  Physical Activity: Not on file  Stress: Not on file  Social Connections: Not on file     Family History:  The patient's  family history includes Cancer in her father; Heart attack (age of onset: 9) in her father; Heart disease in her father; Hypercholesterolemia in her father and mother; Hypertension in her father and mother.   ROS:   Please see the history of present illness.    ROS All other systems reviewed and  are negative.   PHYSICAL EXAM:   VS:  BP 122/78   Pulse 78   Ht 5\' 4"  (1.626 m)   Wt 180 lb 6.4 oz (81.8 kg)   SpO2 98%   BMI 30.97 kg/m   Physical Exam  GEN: Well nourished, well developed, in no acute distress  Neck: no JVD, carotid bruits, or masses Cardiac:RRR; no murmurs, rubs, or gallops  Respiratory:  clear to auscultation bilaterally, normal work of breathing GI: soft, nontender, nondistended, + BS Ext: without cyanosis, clubbing, or edema, Good distal pulses bilaterally Neuro:  Alert and Oriented x 3, Psych: euthymic mood, full affect  Wt Readings from Last 3 Encounters:  08/13/21 180 lb 6.4 oz (81.8 kg)  11/28/20 170 lb (77.1 kg)  04/24/20 177 lb 12.8 oz (80.6 kg)      Studies/Labs Reviewed:   EKG:  EKG is  ordered today.  The ekg ordered today demonstrates NSR, normal EKG  Recent Labs: No results found for requested labs within last 8760 hours.   Lipid Panel    Component Value Date/Time   CHOL 200 (H) 03/22/2020 0734   TRIG 143 03/22/2020 0734   HDL 76 03/22/2020 0734   CHOLHDL 2.6 03/22/2020 0734   LDLCALC 100 (H) 03/22/2020 0734    Additional studies/ records that were reviewed today include:  Holter 2019 NSR with some sinus tachycardia   Risk Assessment/Calculations:         ASSESSMENT:    1. Palpitations   2. Other hyperlipidemia   3. Family history of early CAD   4. Hyperlipidemia, unspecified hyperlipidemia type      PLAN:  In order of problems listed above:  Palpitations-mostly at rest and can last hours. She had a severe reaction to monitor pads in past. She has an apple watch and will try to wear it and record tracings for 2020 to see. Check surveillance labs including TSH. Increase Toprol XL 50 mg daily  HLD-check FLP today  Family history of early CAD  Shared Decision Making/Informed Consent   Shared Decision Making/Informed Consent{   Medication Adjustments/Labs and Tests Ordered: Current medicines are reviewed at  length with the patient today.  Concerns regarding medicines are outlined above.  Medication changes, Labs and Tests ordered today are listed in the Patient Instructions below. Patient Instructions  Medication Instructions:  Your physician has recommended you make the following change in your medication:   INCREASE: Metoprolol Succinate to 50mg  daily  *If you need a refill on your cardiac medications before your next appointment, please call your pharmacy*   Lab Work: TODAY: CMET, CBC, TSH,  FLP  If you have labs (blood work) drawn today and your tests are completely normal, you will receive your results only by: MyChart Message (if you have MyChart) OR A paper copy in the mail If you have any lab test that is abnormal or we need to change your treatment, we will call you to review the results.   Testing/Procedures: Your physician has requested that you have cardiac CT. Cardiac computed tomography (CT) is a painless test that uses an x-ray machine to take clear, detailed pictures of your heart. For further information please visit [email or web address] Please follow instruction sheet as given.   Follow-Up: At [facility], you and your health needs are our priority.  As part of our continuing mission to provide you with exceptional heart care, we have created designated Provider Care Teams.  These Care Teams include your primary Cardiologist (physician) and Advanced Practice Providers (APPs -  Physician Assistants and Nurse Practitioners) who all work together to provide you with the care you need, when you need it.   Your next appointment:   2 month(s)  The format for your next appointment:   In Person  Provider:   Jacolyn Reedy, PA-C     Signed, Jacolyn Reedy, PA-C  08/13/2021 8:48 AM    [facility] [address] [place], [place], [place]  [address] Phone: [phone]; Fax: [phone]

## 2021-08-13 ENCOUNTER — Ambulatory Visit: Payer: BC Managed Care – PPO | Admitting: Physician Assistant

## 2021-08-13 ENCOUNTER — Encounter: Payer: Self-pay | Admitting: Physician Assistant

## 2021-08-13 ENCOUNTER — Other Ambulatory Visit: Payer: Self-pay

## 2021-08-13 VITALS — BP 122/78 | HR 78 | Ht 64.0 in | Wt 180.4 lb

## 2021-08-13 DIAGNOSIS — R002 Palpitations: Secondary | ICD-10-CM | POA: Diagnosis not present

## 2021-08-13 DIAGNOSIS — E7849 Other hyperlipidemia: Secondary | ICD-10-CM | POA: Diagnosis not present

## 2021-08-13 DIAGNOSIS — E785 Hyperlipidemia, unspecified: Secondary | ICD-10-CM

## 2021-08-13 DIAGNOSIS — Z8249 Family history of ischemic heart disease and other diseases of the circulatory system: Secondary | ICD-10-CM

## 2021-08-13 LAB — COMPREHENSIVE METABOLIC PANEL
ALT: 22 IU/L (ref 0–32)
AST: 18 IU/L (ref 0–40)
Albumin/Globulin Ratio: 2 (ref 1.2–2.2)
Albumin: 4.7 g/dL (ref 3.8–4.8)
Alkaline Phosphatase: 75 IU/L (ref 44–121)
BUN/Creatinine Ratio: 10 (ref 9–23)
BUN: 8 mg/dL (ref 6–24)
Bilirubin Total: 0.6 mg/dL (ref 0.0–1.2)
CO2: 22 mmol/L (ref 20–29)
Calcium: 10.1 mg/dL (ref 8.7–10.2)
Chloride: 102 mmol/L (ref 96–106)
Creatinine, Ser: 0.83 mg/dL (ref 0.57–1.00)
Globulin, Total: 2.4 g/dL (ref 1.5–4.5)
Glucose: 95 mg/dL (ref 70–99)
Potassium: 4.5 mmol/L (ref 3.5–5.2)
Sodium: 140 mmol/L (ref 134–144)
Total Protein: 7.1 g/dL (ref 6.0–8.5)
eGFR: 89 mL/min/{1.73_m2} (ref >59)

## 2021-08-13 LAB — LIPID PANEL
Chol/HDL Ratio: 2.6 ratio (ref 0.0–4.4)
Cholesterol, Total: 197 mg/dL (ref 100–199)
HDL: 75 mg/dL (ref >39)
LDL Chol Calc (NIH): 95 mg/dL (ref 0–99)
Triglycerides: 158 mg/dL — ABNORMAL HIGH (ref 0–149)
VLDL Cholesterol Cal: 27 mg/dL (ref 5–40)

## 2021-08-13 LAB — CBC
Hematocrit: 43 % (ref 34.0–46.6)
Hemoglobin: 14.7 g/dL (ref 11.1–15.9)
MCH: 29.9 pg (ref 26.6–33.0)
MCHC: 34.2 g/dL (ref 31.5–35.7)
MCV: 88 fL (ref 79–97)
Platelets: 252 10*3/uL (ref 150–450)
RBC: 4.91 x10E6/uL (ref 3.77–5.28)
RDW: 12.1 % (ref 11.7–15.4)
WBC: 7.1 10*3/uL (ref 3.4–10.8)

## 2021-08-13 LAB — TSH: TSH: 2.68 u[IU]/mL (ref 0.450–4.500)

## 2021-08-13 MED ORDER — METOPROLOL SUCCINATE ER 50 MG PO TB24: 50.0000 mg | ORAL_TABLET | Freq: Every day | ORAL | 3 refills | Status: DC

## 2021-08-13 NOTE — Patient Instructions (Signed)
Medication Instructions:  Your physician has recommended you make the following change in your medication:   INCREASE: Metoprolol Succinate to 50mg  daily  *If you need a refill on your cardiac medications before your next appointment, please call your pharmacy*   Lab Work: TODAY: CMET, CBC, TSH, FLP  If you have labs (blood work) drawn today and your tests are completely normal, you will receive your results only by: MyChart Message (if you have MyChart) OR A paper copy in the mail If you have any lab test that is abnormal or we need to change your treatment, we will call you to review the results.   Testing/Procedures: Your physician has requested that you have cardiac CT. Cardiac computed tomography (CT) is a painless test that uses an x-ray machine to take clear, detailed pictures of your heart. For further information please visit . Please follow instruction sheet as given.   Follow-Up: At [facility], you and your health needs are our priority.  As part of our continuing mission to provide you with exceptional heart care, we have created designated Provider Care Teams.  These Care Teams include your primary Cardiologist (physician) and Advanced Practice Providers (APPs -  Physician Assistants and Nurse Practitioners) who all work together to provide you with the care you need, when you need it.   Your next appointment:   2 month(s)  The format for your next appointment:   In Person  Provider:   [facility], PA-C

## 2021-08-20 ENCOUNTER — Other Ambulatory Visit: Payer: Self-pay

## 2021-08-20 ENCOUNTER — Ambulatory Visit (INDEPENDENT_AMBULATORY_CARE_PROVIDER_SITE_OTHER)
Admission: RE | Admit: 2021-08-20 | Discharge: 2021-08-20 | Disposition: A | Discharge status: Home / Self Care | Payer: Self-pay | Source: Ambulatory Visit | Attending: Physician Assistant | Admitting: Physician Assistant

## 2021-08-20 DIAGNOSIS — E7849 Other hyperlipidemia: Secondary | ICD-10-CM

## 2021-08-20 DIAGNOSIS — Z8249 Family history of ischemic heart disease and other diseases of the circulatory system: Secondary | ICD-10-CM

## 2021-08-20 DIAGNOSIS — R002 Palpitations: Secondary | ICD-10-CM

## 2021-08-20 DIAGNOSIS — E785 Hyperlipidemia, unspecified: Secondary | ICD-10-CM

## 2021-08-29 ENCOUNTER — Other Ambulatory Visit: Payer: Self-pay | Admitting: Physician Assistant

## 2021-10-06 NOTE — Progress Notes (Signed)
Cardiology Office Note    Date:  10/14/2021   ID:  Rhonda Drake, DOB 04-22-1976, MRN [id number]   PCP:  Rhonda Heron, MD   [facility]  Cardiologist:  None   Advanced Practice Provider:  No care team member to display Electrophysiologist:  None   [id number]}   Chief Complaint  Patient presents with   Follow-up    History of Present Illness:  Rhonda Drake is a 45 y.o. female  with a history of palpitations, IBS and HLD. Echo from 01/2018 with mild LVH and normal EF with trace MR, TR and pulmonic regurgitation. She had a negative GXT in 01/2018 for ischemia but with reduced exercise tolerance noted. She had a ZIO monitor - showing NSR - with palpitations she had occasional episodes of tachycardia. No significant arrhythmia noted.    Patient was seen 04/2020 and doing well except for migraine. Head CT ok.  I saw the patient 08/13/2021 complaining of heart rate jumping from 87 up to 120 and waking at night with her heart rate up to 150.  She was going to wear her apple watch more regularly and record tracings for [place] to see.  I increased her Toprol to 50 mg daily and check surveillance labs table including TSH.  Patient comes in for f/u. The increase in Toprol has helped. Palpitations not waking her up. Her apple watch only had inconclusive readings. All were NSR. 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       Past Medical History:  Diagnosis Date   Arthritis    Hyperlipidemia    Irritable bowel syndrome    Mild allergic rhinitis    Palpitations    Rapid heart beat     Past Surgical History:  Procedure Laterality Date   CESAREAN SECTION     x2   WISDOM TOOTH EXTRACTION      Current Medications: Current Meds  Medication Sig   atorvastatin (LIPITOR) 10 MG tablet TAKE 1 TABLET (10 MG TOTAL) BY MOUTH DAILY. PT NEEDS TO KEEP UPCOMING APPT IN OCT FOR FURTHER REFILLS   cetirizine (ZYRTEC) 10 MG tablet Take 10 mg by  mouth daily.   fluticasone (FLONASE) 50 MCG/ACT nasal spray Place into both nostrils daily.   metoprolol succinate (TOPROL-XL) 50 MG 24 hr tablet Take 1 tablet (50 mg total) by mouth daily.   mometasone (NASONEX) 50 MCG/ACT nasal spray Place 2 sprays into the nose daily.   montelukast (SINGULAIR) 10 MG tablet Take 10 mg by mouth daily.   [name] 3-0.02 MG tablet Take 1 tablet by mouth daily.   Olopatadine HCl 0.6 % SOLN olopatadine 0.6 % nasal spray  USE 2 SPRAYS IN EACH NOSTRIL TWICE A DAY   Probiotic Product (PROBIOTIC-10 PO) Take by mouth.   RESTASIS 0.05 % ophthalmic emulsion 1 drop 2 (two) times daily.     Allergies:   Penicillins and Sulfa antibiotics   Social History   Socioeconomic History   Marital status: Married    Spouse name: Not on file   Number of children: 2   Years of education: Not on file   Highest education level: Not on file  Occupational History   Occupation: ART TEACHER  Tobacco Use   Smoking status: Never   Smokeless tobacco: Never  Vaping Use   Vaping Use: Never used  Substance and Sexual Activity   Alcohol use: Yes    Comment: social    Drug  use: Never   Sexual activity: Yes    Partners: Male    Birth control/protection: Pill    Comment: 1st intercourse- 22, partners- 5, married- 14 yrs   Other Topics Concern   Not on file  Social History Narrative   Not on file   Social Determinants of Health   Financial Resource Strain: Not on file  Food Insecurity: Not on file  Transportation Needs: Not on file  Physical Activity: Not on file  Stress: Not on file  Social Connections: Not on file     Family History:  The patient's  family history includes Cancer in her father; Heart attack (age of onset: 30) in her father; Heart disease in her father; Hypercholesterolemia in her father and mother; Hypertension in her father and mother.   ROS:   Please see the history of present illness.    ROS All other systems reviewed and are negative.   PHYSICAL  EXAM:   VS:  BP 114/74   Pulse 77   Ht 5\' 4"  (1.626 m)   Wt 180 lb (81.6 kg)   BMI 30.90 kg/m   Physical Exam  GEN: Well nourished, well developed, in no acute distress  Neck: no JVD, carotid bruits, or masses Cardiac:RRR; no murmurs, rubs, or gallops  Respiratory:  clear to auscultation bilaterally, normal work of breathing GI: soft, nontender, nondistended, + BS Ext: without cyanosis, clubbing, or edema, Good distal pulses bilaterally Neuro:  Alert and Oriented x 3 Psych: euthymic mood, full affect  Wt Readings from Last 3 Encounters:  10/14/21 180 lb (81.6 kg)  08/13/21 180 lb 6.4 oz (81.8 kg)  11/28/20 170 lb (77.1 kg)      Studies/Labs Reviewed:   EKG:  EKG is not ordered today.     Recent Labs: 08/13/2021: ALT 22; BUN 8; Creatinine, Ser 0.83; Hemoglobin 14.7; Platelets 252; Potassium 4.5; Sodium 140; TSH 2.680   Lipid Panel    Component Value Date/Time   CHOL 197 08/13/2021 0833   TRIG 158 (H) 08/13/2021 0833   HDL 75 08/13/2021 0833   CHOLHDL 2.6 08/13/2021 0833   LDLCALC 95 08/13/2021 0833    Additional studies/ records that were reviewed today include:      Risk Assessment/Calculations:         ASSESSMENT:    1. Palpitations   2. Hyperlipidemia, unspecified hyperlipidemia type   3. Family history of early CAD      PLAN:  In order of problems listed above:  Palpitations-doing much better on Increased Toprol XL 50 mg daily. Can take extra 1/2 prn. F/u in 1 yr.   HLD- LDL 95, trig 158-reduce sugars in diet   Family history of early CAD-150 min exercise weekly  Shared Decision Making/Informed Consent        Medication Adjustments/Labs and Tests Ordered: Current medicines are reviewed at length with the patient today.  Concerns regarding medicines are outlined above.  Medication changes, Labs and Tests ordered today are listed in the Patient Instructions below. Patient Instructions  Medication Instructions:   Your physician recommends  that you continue on your current medications as directed. Please refer to the Current Medication list given to you today.  *If you need a refill on your cardiac medications before your next appointment, please call your pharmacy*   Lab Work:  NONE ORDERED  TODAY   If you have labs (blood work) drawn today and your tests are completely normal, you will receive your results only by: MyChart Message (if  you have MyChart) OR A paper copy in the mail If you have any lab test that is abnormal or we need to change your treatment, we will call you to review the results.   Testing/Procedures: NONE ORDERED  TODAY   Follow-Up: At [facility], you and your health needs are our priority.  As part of our continuing mission to provide you with exceptional heart care, we have created designated Provider Care Teams.  These Care Teams include your primary Cardiologist (physician) and Advanced Practice Providers (APPs -  Physician Assistants and Nurse Practitioners) who all work together to provide you with the care you need, when you need it.  We recommend signing up for the patient portal called "MyChart".  Sign up information is provided on this After Visit Summary.  MyChart is used to connect with patients for Virtual Visits (Telemedicine).  Patients are able to view lab/test results, encounter notes, upcoming appointments, etc.  Non-urgent messages can be sent to your provider as well.   To learn more about what you can do with MyChart, go to [email or web address].    Your next appointment:   1 year(s)  The format for your next appointment:   In Person  Provider:   Dr. Shari Prows    Other Instructions   Signed, Jacolyn Reedy, PA-C  10/14/2021 8:11 AM    [facility] [address] [place], [place], [place]  [address] Phone: [phone]; Fax: [phone]

## 2021-10-14 ENCOUNTER — Ambulatory Visit: Payer: BC Managed Care – PPO | Admitting: Physician Assistant

## 2021-10-14 ENCOUNTER — Other Ambulatory Visit: Payer: Self-pay

## 2021-10-14 ENCOUNTER — Encounter: Payer: Self-pay | Admitting: Physician Assistant

## 2021-10-14 VITALS — BP 114/74 | HR 77 | Ht 64.0 in | Wt 180.0 lb

## 2021-10-14 DIAGNOSIS — Z8249 Family history of ischemic heart disease and other diseases of the circulatory system: Secondary | ICD-10-CM

## 2021-10-14 DIAGNOSIS — E785 Hyperlipidemia, unspecified: Secondary | ICD-10-CM | POA: Diagnosis not present

## 2021-10-14 DIAGNOSIS — R002 Palpitations: Secondary | ICD-10-CM | POA: Diagnosis not present

## 2021-10-14 NOTE — Patient Instructions (Signed)
Medication Instructions:   Your physician recommends that you continue on your current medications as directed. Please refer to the Current Medication list given to you today.  *If you need a refill on your cardiac medications before your next appointment, please call your pharmacy*   Lab Work:  NONE ORDERED  TODAY   If you have labs (blood work) drawn today and your tests are completely normal, you will receive your results only by: MyChart Message (if you have MyChart) OR A paper copy in the mail If you have any lab test that is abnormal or we need to change your treatment, we will call you to review the results.   Testing/Procedures: NONE ORDERED  TODAY   Follow-Up: At [facility], you and your health needs are our priority.  As part of our continuing mission to provide you with exceptional heart care, we have created designated Provider Care Teams.  These Care Teams include your primary Cardiologist (physician) and Advanced Practice Providers (APPs -  Physician Assistants and Nurse Practitioners) who all work together to provide you with the care you need, when you need it.  We recommend signing up for the patient portal called "MyChart".  Sign up information is provided on this After Visit Summary.  MyChart is used to connect with patients for Virtual Visits (Telemedicine).  Patients are able to view lab/test results, encounter notes, upcoming appointments, etc.  Non-urgent messages can be sent to your provider as well.   To learn more about what you can do with MyChart, go to [email or web address].    Your next appointment:   1 year(s)  The format for your next appointment:   In Person  Provider:   Dr. Shari Prows    Other Instructions

## 2021-11-22 ENCOUNTER — Other Ambulatory Visit: Payer: Self-pay | Admitting: Physician Assistant

## 2021-12-03 ENCOUNTER — Encounter: Payer: Self-pay | Admitting: Obstetrics & Gynecology

## 2021-12-03 ENCOUNTER — Other Ambulatory Visit: Payer: Self-pay

## 2021-12-03 ENCOUNTER — Ambulatory Visit (INDEPENDENT_AMBULATORY_CARE_PROVIDER_SITE_OTHER): Payer: BC Managed Care – PPO | Admitting: Obstetrics & Gynecology

## 2021-12-03 VITALS — BP 102/70 | HR 68 | Resp 16 | Ht 63.5 in | Wt 177.0 lb

## 2021-12-03 DIAGNOSIS — Z3041 Encounter for surveillance of contraceptive pills: Secondary | ICD-10-CM

## 2021-12-03 DIAGNOSIS — Z01419 Encounter for gynecological examination (general) (routine) without abnormal findings: Secondary | ICD-10-CM

## 2021-12-03 DIAGNOSIS — Z6830 Body mass index (BMI) 30.0-30.9, adult: Secondary | ICD-10-CM | POA: Diagnosis not present

## 2021-12-03 DIAGNOSIS — E6609 Other obesity due to excess calories: Secondary | ICD-10-CM | POA: Diagnosis not present

## 2021-12-03 MED ORDER — NIKKI 3-0.02 MG PO TABS: 1.0000 | ORAL_TABLET | Freq: Every day | ORAL | 4 refills | Status: DC

## 2021-12-03 NOTE — Progress Notes (Signed)
Rhonda Drake October 01, 1976 [id number]   History:    46 y.o. G2P2L2  Married.  Tourist information centre manager.  Rhonda Drake 46 yo 10th grade and Rhonda Drake is 46 yo in 8th grade.   RP:  Established patient presenting for annual gyn exam    HPI: Well on [place].  No BTB.  No pelvic pain.  No pain with IC.  Pap 11/2020 Negative.  Urine/BMs normal.  Breasts normal.  Screening mammo neg 06/2021.  BMI 30.86.  On a low Calorie/Carb diet and a fitness program.  Health labs with Fam MD.     Past medical history,surgical history, family history and social history were all reviewed and documented in the EPIC chart.  Gynecologic History Patient's last menstrual period was 11/17/2021 (exact date).  Obstetric History OB History  Gravida Para Term Preterm AB Living  2 2       2   SAB IAB Ectopic Multiple Live Births               # Outcome Date GA Lbr Len/2nd Weight Sex Delivery Anes PTL Lv  2 Para           1 Para              ROS: A ROS was performed and pertinent positives and negatives are included in the history.  GENERAL: No fevers or chills. HEENT: No change in vision, no earache, sore throat or sinus congestion. NECK: No pain or stiffness. CARDIOVASCULAR: No chest pain or pressure. No palpitations. PULMONARY: No shortness of breath, cough or wheeze. GASTROINTESTINAL: No abdominal pain, nausea, vomiting or diarrhea, melena or bright red blood per rectum. GENITOURINARY: No urinary frequency, urgency, hesitancy or dysuria. MUSCULOSKELETAL: No joint or muscle pain, no back pain, no recent trauma. DERMATOLOGIC: No rash, no itching, no lesions. ENDOCRINE: No polyuria, polydipsia, no heat or cold intolerance. No recent change in weight. HEMATOLOGICAL: No anemia or easy bruising or bleeding. NEUROLOGIC: No headache, seizures, numbness, tingling or weakness. PSYCHIATRIC: No depression, no loss of interest in normal activity or change in sleep pattern.     Exam:   BP 102/70    Pulse 68    Resp 16    Ht 5'  3.5" (1.613 m)    Wt 177 lb (80.3 kg)    LMP 11/17/2021 (Exact Date) Comment: on ocps   BMI 30.86 kg/m   Body mass index is 30.86 kg/m.  General appearance : Well developed well nourished female. No acute distress HEENT: Eyes: no retinal hemorrhage or exudates,  Neck supple, trachea midline, no carotid bruits, no thyroidmegaly Lungs: Clear to auscultation, no rhonchi or wheezes, or rib retractions  Heart: Regular rate and rhythm, no murmurs or gallops Breast:Examined in sitting and supine position were symmetrical in appearance, no palpable masses or tenderness,  no skin retraction, no nipple inversion, no nipple discharge, no skin discoloration, no axillary or supraclavicular lymphadenopathy Abdomen: no palpable masses or tenderness, no rebound or guarding Extremities: no edema or skin discoloration or tenderness  Pelvic: Vulva: Normal             Vagina: No gross lesions or discharge  Cervix: No gross lesions or discharge  Uterus  AV, normal size, shape and consistency, non-tender and mobile  Adnexa  Without masses or tenderness  Anus: Normal   Assessment/Plan:  46 y.o. female for annual exam   1. Well female exam with routine gynecological exam Well on [place].  No BTB.  No pelvic pain.  No  pain with IC.  Pap 11/2020 Negative.  Urine/BMs normal.  Breasts normal.  Screening mammo neg 06/2021.  BMI 30.86.  On a low Calorie/Carb diet and a fitness program.  Health labs with Fam MD.  May schedule a screening Colonoscopy.  2. Encounter for surveillance of contraceptive pills Well on [place].  No BTB.  No pelvic pain.  No pain with IC. No CI to continue on the BCPs.  Prescription sent to pharmacy.  3. Class 1 obesity due to excess calories with serious comorbidity and body mass index (BMI) of 30.0 to 30.9 in adult  Low Calorie/Carb diet.  Increase fitness activities.  Other orders - [name] 3-0.02 MG tablet; Take 1 tablet by mouth daily.   Rhonda Del MD, 3:49 PM 12/03/2021

## 2022-02-08 ENCOUNTER — Other Ambulatory Visit: Payer: Self-pay | Admitting: Obstetrics & Gynecology

## 2022-06-30 ENCOUNTER — Encounter: Payer: Self-pay | Admitting: Obstetrics & Gynecology

## 2022-08-16 ENCOUNTER — Other Ambulatory Visit: Payer: Self-pay | Admitting: Physician Assistant

## 2022-11-10 NOTE — Progress Notes (Deleted)
Cardiology Office Note:    Date:  11/10/2022   ID:  Rhonda Drake, DOB 1976/06/07, MRN [id number]  PCP:  Aretta Nip, MD   [facility] Providers Cardiologist:  None     Referring MD: Aretta Nip, MD    History of Present Illness:    Rhonda Drake is a 47 y.o. female with a hx of HLD, IBS and palpitations who returns to clinic for follow-up.  Per review of the record, TTE 01/2018 with mild LVH and normal EF with trace MR, TR and pulmonic regurgitation. She had a negative GXT in 01/2018 for ischemia but with reduced exercise tolerance noted. She had a zio monitor 02/2018 which demonstrated NSR with no significant arrhythmia noted.    Last saw Ermalinda Barrios, PA-C where she was doing well.  Today, ***  Past Medical History:  Diagnosis Date   Arthritis    Hyperlipidemia    Irritable bowel syndrome    Mild allergic rhinitis    Palpitations    Rapid heart beat     Past Surgical History:  Procedure Laterality Date   CESAREAN SECTION     x2   WISDOM TOOTH EXTRACTION      Current Medications: No outpatient medications have been marked as taking for the 11/16/22 encounter (Appointment) with Freada Bergeron, MD.     Allergies:   Penicillins and Sulfa antibiotics   Social History   Socioeconomic History   Marital status: Married    Spouse name: Not on file   Number of children: 2   Years of education: Not on file   Highest education level: Not on file  Occupational History   Occupation: ART TEACHER  Tobacco Use   Smoking status: Never   Smokeless tobacco: Never  Vaping Use   Vaping Use: Never used  Substance and Sexual Activity   Alcohol use: Yes    Comment: social    Drug use: Never   Sexual activity: Yes    Partners: Male    Birth control/protection: Pill    Comment: 1st intercourse- 22, partners- 25, married- 16 yrs   Other Topics Concern   Not on file  Social History Narrative   Not on file   Social Determinants of  Health   Financial Resource Strain: Not on file  Food Insecurity: Not on file  Transportation Needs: Not on file  Physical Activity: Not on file  Stress: Not on file  Social Connections: Not on file     Family History: The patient's ***family history includes Cancer in her father; Heart attack (age of onset: 65) in her father; Heart disease in her father; Hypercholesterolemia in her father and mother; Hypertension in her father and mother.  ROS:   Please see the history of present illness.    *** All other systems reviewed and are negative.  EKGs/Labs/Other Studies Reviewed:    The following studies were reviewed today: Ca score 08/2021: FINDINGS: Coronary arteries: Normal origins.   Coronary Calcium Score:   Left main: 0   Left anterior descending artery: 0   Left circumflex artery: 0   Right coronary artery: 0   Total: 0   Percentile: 0   Pericardium: Normal.   Ascending Aorta: Normal caliber.   Non-cardiac: See separate report from [facility].   IMPRESSION: Coronary calcium score of 0. This was 0 percentile for age-, race-, and sex-matched controls.  EKG:  EKG is *** ordered today.  The ekg ordered today demonstrates ***  Recent Labs: No results found for requested labs within last 365 days.  Recent Lipid Panel    Component Value Date/Time   CHOL 197 08/13/2021 0833   TRIG 158 (H) 08/13/2021 0833   HDL 75 08/13/2021 0833   CHOLHDL 2.6 08/13/2021 0833   LDLCALC 95 08/13/2021 0833     Risk Assessment/Calculations:   {Does this patient have ATRIAL FIBRILLATION?[phone]}  No BP recorded.  {Refresh Note OR Click here to enter BP  :1}***         Physical Exam:    VS:  There were no vitals taken for this visit.    Wt Readings from Last 3 Encounters:  12/03/21 177 lb (80.3 kg)  10/14/21 180 lb (81.6 kg)  08/13/21 180 lb 6.4 oz (81.8 kg)     GEN: *** Well nourished, well developed in no acute distress HEENT: Normal NECK: No JVD;  No carotid bruits LYMPHATICS: No lymphadenopathy CARDIAC: ***RRR, no murmurs, rubs, gallops RESPIRATORY:  Clear to auscultation without rales, wheezing or rhonchi  ABDOMEN: Soft, non-tender, non-distended MUSCULOSKELETAL:  No edema; No deformity  SKIN: Warm and dry NEUROLOGIC:  Alert and oriented x 3 PSYCHIATRIC:  Normal affect   ASSESSMENT:    No diagnosis found. PLAN:    In order of problems listed above:  #Palpitations: Cardiac monitor 02/2018 with no significant arrhythmias or ectopy. Currently, *** -Continue metop 50mg  XL daily  #HLD: -Continue lipitor 10mg  daily  #Family history of CAD: Ca score 0 in 08/2021 -Continue lipitor 10mg  daily      {Are you ordering a CV Procedure (e.g. stress test, cath, DCCV, TEE, etc)?   Press F2        :[id number]}    Medication Adjustments/Labs and Tests Ordered: Current medicines are reviewed at length with the patient today.  Concerns regarding medicines are outlined above.  No orders of the defined types were placed in this encounter.  No orders of the defined types were placed in this encounter.   There are no Patient Instructions on file for this visit.   Signed, Freada Bergeron, MD  11/10/2022 8:01 PM    [place]

## 2022-11-16 ENCOUNTER — Ambulatory Visit: Payer: BC Managed Care – PPO | Admitting: Cardiology

## 2022-11-16 NOTE — Progress Notes (Incomplete)
Cardiology Office Note:    Date:  11/16/2022   ID:  Rhonda Drake, DOB 20-Mar-1976, MRN [id number]  PCP:  Clayborn Heron, MD   [facility] Providers Cardiologist:  None     Referring MD: Clayborn Heron, MD    History of Present Illness:    Rhonda Drake is a 47 y.o. female with a hx of HLD, IBS and palpitations who returns to clinic for follow-up.  Per review of the record, TTE 01/2018 with mild LVH and normal EF with trace MR, TR and pulmonic regurgitation. She had a negative GXT in 01/2018 for ischemia but with reduced exercise tolerance noted. She had a zio monitor 02/2018 which demonstrated NSR with no significant arrhythmia noted.    Last saw Jacolyn Reedy, PA-C where she was doing well.  Today, the patient states that ***  She denies any palpitations, chest pain, shortness of breath, or peripheral edema. No lightheadedness, headaches, syncope, orthopnea, or PND.  Past Medical History:  Diagnosis Date   Arthritis    Hyperlipidemia    Irritable bowel syndrome    Mild allergic rhinitis    Palpitations    Rapid heart beat     Past Surgical History:  Procedure Laterality Date   CESAREAN SECTION     x2   WISDOM TOOTH EXTRACTION      Current Medications: No outpatient medications have been marked as taking for the 11/16/22 encounter (Appointment) with Meriam Sprague, MD.     Allergies:   Penicillins and Sulfa antibiotics   Social History   Socioeconomic History   Marital status: Married    Spouse name: Not on file   Number of children: 2   Years of education: Not on file   Highest education level: Not on file  Occupational History   Occupation: ART TEACHER  Tobacco Use   Smoking status: Never   Smokeless tobacco: Never  Vaping Use   Vaping Use: Never used  Substance and Sexual Activity   Alcohol use: Yes    Comment: social    Drug use: Never   Sexual activity: Yes    Partners: Male    Birth control/protection: Pill     Comment: 1st intercourse- 22, partners- 5, married- 14 yrs   Other Topics Concern   Not on file  Social History Narrative   Not on file   Social Determinants of Health   Financial Resource Strain: Not on file  Food Insecurity: Not on file  Transportation Needs: Not on file  Physical Activity: Not on file  Stress: Not on file  Social Connections: Not on file     Family History: The patient's family history includes Cancer in her father; Heart attack (age of onset: 48) in her father; Heart disease in her father; Hypercholesterolemia in her father and mother; Hypertension in her father and mother.  ROS:   Review of Systems  Constitutional:  Negative for chills and fever.  HENT:  Negative for nosebleeds and tinnitus.   Eyes:  Negative for blurred vision and pain.  Respiratory:  Negative for cough, hemoptysis, shortness of breath and stridor.   Cardiovascular:  Negative for chest pain, palpitations, orthopnea, claudication, leg swelling and PND.  Gastrointestinal:  Negative for blood in stool, diarrhea, nausea and vomiting.  Genitourinary:  Negative for dysuria and hematuria.  Musculoskeletal:  Negative for falls.  Neurological:  Negative for dizziness, loss of consciousness and headaches.  Psychiatric/Behavioral:  Negative for depression, hallucinations and substance abuse. The patient  does not have insomnia.      EKGs/Labs/Other Studies Reviewed:    The following studies were reviewed today:  Ca score 08/2021: FINDINGS: Coronary arteries: Normal origins.   Coronary Calcium Score:   Left main: 0   Left anterior descending artery: 0   Left circumflex artery: 0   Right coronary artery: 0   Total: 0   Percentile: 0   Pericardium: Normal.   Ascending Aorta: Normal caliber.   Non-cardiac: See separate report from [facility].   IMPRESSION: Coronary calcium score of 0. This was 0 percentile for age-, race-, and sex-matched controls.  EKG:  EKG is  personally reviewed. 11/16/2022: ***  Recent Labs: No results found for requested labs within last 365 days.   Recent Lipid Panel    Component Value Date/Time   CHOL 197 08/13/2021 0833   TRIG 158 (H) 08/13/2021 0833   HDL 75 08/13/2021 0833   CHOLHDL 2.6 08/13/2021 0833   LDLCALC 95 08/13/2021 0833     Risk Assessment/Calculations:   {Does this patient have ATRIAL FIBRILLATION?[phone]}  No BP recorded.  {Refresh Note OR Click here to enter BP  :1}***         Physical Exam:    VS:  There were no vitals taken for this visit.    Wt Readings from Last 3 Encounters:  12/03/21 177 lb (80.3 kg)  10/14/21 180 lb (81.6 kg)  08/13/21 180 lb 6.4 oz (81.8 kg)     GEN: Well nourished, well developed in no acute distress HEENT: Normal NECK: No JVD; No carotid bruits LYMPHATICS: No lymphadenopathy CARDIAC: ***RRR, no murmurs, rubs, gallops RESPIRATORY:  Clear to auscultation without rales, wheezing or rhonchi  ABDOMEN: Soft, non-tender, non-distended MUSCULOSKELETAL:  No edema; No deformity  SKIN: Warm and dry NEUROLOGIC:  Alert and oriented x 3 PSYCHIATRIC:  Normal affect   ASSESSMENT:    No diagnosis found. PLAN:    In order of problems listed above:  #Palpitations: Cardiac monitor 02/2018 with no significant arrhythmias or ectopy. Currently, *** -Continue metop 50mg  XL daily  #HLD: -Continue lipitor 10mg  daily  #Family history of CAD: Ca score 0 in 08/2021 -Continue lipitor 10mg  daily      {Are you ordering a CV Procedure (e.g. stress test, cath, DCCV, TEE, etc)?   Press F2        :[id number]}  Follow-up:  Medication Adjustments/Labs and Tests Ordered: Current medicines are reviewed at length with the patient today.  Concerns regarding medicines are outlined above.   No orders of the defined types were placed in this encounter.  No orders of the defined types were placed in this encounter.  There are no Patient Instructions on file for this visit.    I,Mathew Stumpf,acting as a Education administrator for Freada Bergeron, MD.,have documented all relevant documentation on the behalf of Freada Bergeron, MD,as directed by  Freada Bergeron, MD while in the presence of Freada Bergeron, MD.  ***  Signed, Madelin Rear  11/16/2022 8:00 AM    [place]

## 2022-11-20 ENCOUNTER — Other Ambulatory Visit: Payer: Self-pay | Admitting: Physician Assistant

## 2022-11-20 ENCOUNTER — Telehealth: Payer: Self-pay

## 2022-11-20 NOTE — Telephone Encounter (Signed)
[facility] asking to speak with Ermalinda Barrios PA-C, regarding a medication  / license question. [facility] [phone]  Pt last visit with [facility] cardiology was 10/14/2021?  Pt has been seen by  [facility] on 11/14/2022....  I will forward this message to Ms. Bonnell Public, PA-C.

## 2022-11-27 ENCOUNTER — Other Ambulatory Visit: Payer: Self-pay

## 2022-11-27 ENCOUNTER — Other Ambulatory Visit: Payer: Self-pay | Admitting: Physician Assistant

## 2022-11-27 MED ORDER — METOPROLOL SUCCINATE ER 50 MG PO TB24: 50.0000 mg | ORAL_TABLET | Freq: Every day | ORAL | 0 refills | Status: DC

## 2022-12-04 ENCOUNTER — Encounter: Payer: Self-pay | Admitting: Obstetrics & Gynecology

## 2022-12-04 ENCOUNTER — Ambulatory Visit (INDEPENDENT_AMBULATORY_CARE_PROVIDER_SITE_OTHER): Payer: BC Managed Care – PPO | Admitting: Obstetrics & Gynecology

## 2022-12-04 VITALS — BP 132/88 | Ht 63.5 in | Wt 178.0 lb

## 2022-12-04 DIAGNOSIS — Z3041 Encounter for surveillance of contraceptive pills: Secondary | ICD-10-CM

## 2022-12-04 DIAGNOSIS — Z01419 Encounter for gynecological examination (general) (routine) without abnormal findings: Secondary | ICD-10-CM | POA: Diagnosis not present

## 2022-12-04 MED ORDER — NIKKI 3-0.02 MG PO TABS: 1.0000 | ORAL_TABLET | Freq: Every day | ORAL | 4 refills | Status: DC

## 2022-12-04 NOTE — Progress Notes (Signed)
Rhonda Drake Alberto [birth year] [id number]   History:    47 y.o. G2P2L2  Married.  Automotive engineer.  Donna Christen 47 yo 11th grade and Fatima Sanger is 47 yo in 9th grade.   RP:  Established patient presenting for annual gyn exam    HPI: Well on [place].  No BTB.  No pelvic pain.  No pain with IC.  Pap 11/2020 Negative.  Urine/BMs normal.  Breasts normal.  Screening mammo neg 06/2022.  BMI 31.04.  On a low Calorie/Carb diet and a fitness program.  Health labs with Fam MD.  Recommend scheduling a [place] this year.  Past medical history,surgical history, family history and social history were all reviewed and documented in the EPIC chart.  Gynecologic History Patient's last menstrual period was 11/20/2022 (approximate).  Obstetric History OB History  Gravida Para Term Preterm AB Living  2 2       2   SAB IAB Ectopic Multiple Live Births               # Outcome Date GA Lbr Len/2nd Weight Sex Delivery Anes PTL Lv  2 Para           1 Para              ROS: A ROS was performed and pertinent positives and negatives are included in the history. GENERAL: No fevers or chills. HEENT: No change in vision, no earache, sore throat or sinus congestion. NECK: No pain or stiffness. CARDIOVASCULAR: No chest pain or pressure. No palpitations. PULMONARY: No shortness of breath, cough or wheeze. GASTROINTESTINAL: No abdominal pain, nausea, vomiting or diarrhea, melena or bright red blood per rectum. GENITOURINARY: No urinary frequency, urgency, hesitancy or dysuria. MUSCULOSKELETAL: No joint or muscle pain, no back pain, no recent trauma. DERMATOLOGIC: No rash, no itching, no lesions. ENDOCRINE: No polyuria, polydipsia, no heat or cold intolerance. No recent change in weight. HEMATOLOGICAL: No anemia or easy bruising or bleeding. NEUROLOGIC: No headache, seizures, numbness, tingling or weakness. PSYCHIATRIC: No depression, no loss of interest in normal activity or change in sleep pattern.     Exam:   BP 132/88  (BP Location: Left Arm, Patient Position: Sitting, Cuff Size: Normal)   Ht 5' 3.5" (1.613 m)   Wt 178 lb (80.7 kg)   LMP 11/20/2022 (Approximate)   BMI 31.04 kg/m   Body mass index is 31.04 kg/m.  General appearance : Well developed well nourished female. No acute distress HEENT: Eyes: no retinal hemorrhage or exudates,  Neck supple, trachea midline, no carotid bruits, no thyroidmegaly Lungs: Clear to auscultation, no rhonchi or wheezes, or rib retractions  Heart: Regular rate and rhythm, no murmurs or gallops Breast:Examined in sitting and supine position were symmetrical in appearance, no palpable masses or tenderness,  no skin retraction, no nipple inversion, no nipple discharge, no skin discoloration, no axillary or supraclavicular lymphadenopathy Abdomen: no palpable masses or tenderness, no rebound or guarding Extremities: no edema or skin discoloration or tenderness  Pelvic: Vulva: Normal             Vagina: No gross lesions or discharge  Cervix: No gross lesions or discharge  Uterus  AV, normal size, shape and consistency, non-tender and mobile  Adnexa  Without masses or tenderness  Anus: Normal   Assessment/Plan:  47 y.o. female for annual exam   1. Well female exam with routine gynecological exam Well on [place].  No BTB.  No pelvic pain.  No pain with IC.  Pap 11/2020  Negative.  Urine/BMs normal.  Breasts normal.  Screening mammo neg 06/2022.  BMI 31.04.  On a low Calorie/Carb diet and a fitness program.  Health labs with Fam MD.  Recommend scheduling a Colono this year.  2. Encounter for surveillance of contraceptive pills Well on [place].  No BTB.  No pelvic pain.  No pain with IC.  No CI to continue on BCPs.  Prescription sent to pharmacy.  Other orders - SYMBICORT 80-4.5 MCG/ACT inhaler; 2 puffs Inhalation Twice a day for 30 days - albuterol (VENTOLIN HFA) 108 (90 Base) MCG/ACT inhaler; INHALE 2 PUFFS BY MOUTH EVERY 4 TO 6 HOURS AS NEEDED FOR COUGH/WHEEZE - [name]  3-0.02 MG tablet; Take 1 tablet by mouth daily.   Princess Bruins MD, 3:45 PM

## 2022-12-19 NOTE — Progress Notes (Unsigned)
Cardiology Office Note:    Date:  12/23/2022   ID:  Rhonda Drake, DOB 1976-03-07, MRN [id number]  PCP:  Lurline Del, DO   [facility] Providers Cardiologist:  None {  Referring MD: Aretta Nip, MD    History of Present Illness:    Rhonda Drake is a 47 y.o. female with a hx of HLD, IBS and palpitations who presents to clinic for follow-up.  Per review of the record, Ca score 08/2021 0. Echo from 01/2018 with mild LVH and normal EF with trace MR, TR and pulmonic regurgitation. She had a negative GXT in 01/2018 for ischemia but with reduced exercise tolerance noted. She had a ZIO monitor - showing NSR - with palpitations she had occasional episodes of tachycardia. No significant arrhythmia noted.    She was last seen in clinic by Ermalinda Barrios on 10/2021 where she was doing better. Metop had been increased and palpitations improved.  Today, the patient overall feels well. Palpitations overall significantly improved on metoprolol. No chest pain, SOB, orthopnea, PND or LE edema. She suffered from pneumonia and multiple viral URIs and she is now finally recovering. Tolerating medications as prescribed.   Past Medical History:  Diagnosis Date   Arthritis    Hyperlipidemia    Irritable bowel syndrome    Mild allergic rhinitis    Palpitations    Rapid heart beat     Past Surgical History:  Procedure Laterality Date   CESAREAN SECTION     x2   WISDOM TOOTH EXTRACTION      Current Medications: Current Meds  Medication Sig   albuterol (VENTOLIN HFA) 108 (90 Base) MCG/ACT inhaler INHALE 2 PUFFS BY MOUTH EVERY 4 TO 6 HOURS AS NEEDED FOR COUGH/WHEEZE   atorvastatin (LIPITOR) 10 MG tablet TAKE 1 TABLET BY MOUTH EVERY DAY   fluticasone (FLONASE) 50 MCG/ACT nasal spray Place into both nostrils daily.   metoprolol succinate (TOPROL-XL) 50 MG 24 hr tablet Take 1 tablet (50 mg total) by mouth daily.   montelukast (SINGULAIR) 10 MG tablet Take 10 mg by mouth  daily.   [name] 3-0.02 MG tablet Take 1 tablet by mouth daily.   Olopatadine HCl 0.6 % SOLN olopatadine 0.6 % nasal spray  USE 2 SPRAYS IN EACH NOSTRIL TWICE A DAY   Probiotic Product (PROBIOTIC-10 PO) Take by mouth.   SYMBICORT 80-4.5 MCG/ACT inhaler 2 puffs Inhalation Twice a day for 30 days     Allergies:   Penicillins and Sulfa antibiotics   Social History   Socioeconomic History   Marital status: Married    Spouse name: Not on file   Number of children: 2   Years of education: Not on file   Highest education level: Not on file  Occupational History   Occupation: ART TEACHER  Tobacco Use   Smoking status: Never    Passive exposure: Never   Smokeless tobacco: Never  Vaping Use   Vaping Use: Never used  Substance and Sexual Activity   Alcohol use: Yes    Comment: social    Drug use: Never   Sexual activity: Yes    Partners: Male    Birth control/protection: Pill    Comment: 1st intercourse- 22, partners- 8, married- 19 yrs   Other Topics Concern   Not on file  Social History Narrative   Not on file   Social Determinants of Health   Financial Resource Strain: Not on file  Food Insecurity: Not on file  Transportation Needs:  Not on file  Physical Activity: Not on file  Stress: Not on file  Social Connections: Not on file     Family History: The patient's family history includes Cancer in her father; Heart attack (age of onset: 15) in her father; Heart disease in her father; Hypercholesterolemia in her father and mother; Hypertension in her father and mother.  ROS:   Please see the history of present illness.     All other systems reviewed and are negative.  EKGs/Labs/Other Studies Reviewed:    The following studies were reviewed today: Ca score 08/2021: FINDINGS: Coronary arteries: Normal origins.   Coronary Calcium Score:   Left main: 0   Left anterior descending artery: 0   Left circumflex artery: 0   Right coronary artery: 0   Total: 0    Percentile: 0   Pericardium: Normal.   Ascending Aorta: Normal caliber.   Non-cardiac: See separate report from [facility].   IMPRESSION: Coronary calcium score of 0. This was 0 percentile for age-, race-, and sex-matched controls.    EKG:  EKG is  ordered today.  The ekg ordered today demonstrates NSR 75  Recent Labs: No results found for requested labs within last 365 days.  Recent Lipid Panel    Component Value Date/Time   CHOL 197 08/13/2021 0833   TRIG 158 (H) 08/13/2021 0833   HDL 75 08/13/2021 0833   CHOLHDL 2.6 08/13/2021 0833   LDLCALC 95 08/13/2021 0833     Risk Assessment/Calculations:            Physical Exam:    VS:  BP (!) 131/94   Pulse 75   Ht 5' 4"$  (1.626 m)   Wt 180 lb 9.6 oz (81.9 kg)   LMP 11/20/2022 (Approximate)   SpO2 99%   BMI 31.00 kg/m     Wt Readings from Last 3 Encounters:  12/23/22 180 lb 9.6 oz (81.9 kg)  12/04/22 178 lb (80.7 kg)  12/03/21 177 lb (80.3 kg)     GEN:  Well nourished, well developed in no acute distress HEENT: Normal NECK: No JVD; No carotid bruits CARDIAC: RRR, no murmurs, rubs, gallops RESPIRATORY:  Clear to auscultation without rales, wheezing or rhonchi  ABDOMEN: Soft, non-tender, non-distended MUSCULOSKELETAL:  No edema; No deformity  SKIN: Warm and dry NEUROLOGIC:  Alert and oriented x 3 PSYCHIATRIC:  Normal affect   ASSESSMENT:    1. Hyperlipidemia, unspecified hyperlipidemia type   2. Family history of early CAD   3. Other hyperlipidemia   4. Medication management    PLAN:    In order of problems listed above:  #Palpitations: Cardiac monitor in 2019 with no significant ectopy or arrhythmias. Currently, symptoms are well controlled on metop. -Continue metop 39m XL daily  #HLD: #Family History of CAD: Ca score 0 in 2022.  -Continue lipitor 147mdaily -Repeat lipids in the next 4-6 weeks for monitoring           Medication Adjustments/Labs and Tests Ordered: Current  medicines are reviewed at length with the patient today.  Concerns regarding medicines are outlined above.  Orders Placed This Encounter  Procedures   Lipid Profile   EKG 12-Lead   No orders of the defined types were placed in this encounter.   Patient Instructions  Medication Instructions:   Your physician recommends that you continue on your current medications as directed. Please refer to the Current Medication list given to you today.  *If you need a refill on your cardiac  medications before your next appointment, please call your pharmacy*   Lab Work:  IN 4[facility] [place] OFFICE--CHECK LIPIDS--PLEASE COME FASTING TO THIS LAB APPOINTMENT  If you have labs (blood work) drawn today and your tests are completely normal, you will receive your results only by: [place] (if you have MyChart) OR A paper copy in the mail If you have any lab test that is abnormal or we need to change your treatment, we will call you to review the results.    Follow-Up: At [facility], you and your health needs are our priority.  As part of our continuing mission to provide you with exceptional heart care, we have created designated Provider Care Teams.  These Care Teams include your primary Cardiologist (physician) and Advanced Practice Providers (APPs -  Physician Assistants and Nurse Practitioners) who all work together to provide you with the care you need, when you need it.  We recommend signing up for the patient portal called "MyChart".  Sign up information is provided on this After Visit Summary.  MyChart is used to connect with patients for Virtual Visits (Telemedicine).  Patients are able to view lab/test results, encounter notes, upcoming appointments, etc.  Non-urgent messages can be sent to your provider as well.   To learn more about what you can do with MyChart, go to [email or web address].    Your next appointment:   1 year(s)  Provider:   DR. Johney Frame      Signed, Freada Bergeron, MD  12/23/2022 5:12 PM    [facility]

## 2022-12-21 ENCOUNTER — Other Ambulatory Visit: Payer: Self-pay

## 2022-12-21 MED ORDER — METOPROLOL SUCCINATE ER 50 MG PO TB24: 50.0000 mg | ORAL_TABLET | Freq: Every day | ORAL | 0 refills | Status: DC

## 2022-12-23 ENCOUNTER — Ambulatory Visit: Payer: BC Managed Care – PPO | Attending: Cardiology | Admitting: Cardiology

## 2022-12-23 ENCOUNTER — Encounter: Payer: Self-pay | Admitting: Cardiology

## 2022-12-23 VITALS — BP 131/94 | HR 75 | Ht 64.0 in | Wt 180.6 lb

## 2022-12-23 DIAGNOSIS — Z8249 Family history of ischemic heart disease and other diseases of the circulatory system: Secondary | ICD-10-CM

## 2022-12-23 DIAGNOSIS — E785 Hyperlipidemia, unspecified: Secondary | ICD-10-CM

## 2022-12-23 DIAGNOSIS — E7849 Other hyperlipidemia: Secondary | ICD-10-CM

## 2022-12-23 DIAGNOSIS — Z79899 Other long term (current) drug therapy: Secondary | ICD-10-CM | POA: Diagnosis not present

## 2022-12-23 NOTE — Patient Instructions (Signed)
Medication Instructions:   Your physician recommends that you continue on your current medications as directed. Please refer to the Current Medication list given to you today.  *If you need a refill on your cardiac medications before your next appointment, please call your pharmacy*   Lab Work:  IN 4-6 [place] OFFICE--CHECK LIPIDS--PLEASE COME FASTING TO THIS LAB APPOINTMENT  If you have labs (blood work) drawn today and your tests are completely normal, you will receive your results only by: [place] (if you have MyChart) OR A paper copy in the mail If you have any lab test that is abnormal or we need to change your treatment, we will call you to review the results.    Follow-Up: At [facility], you and your health needs are our priority.  As part of our continuing mission to provide you with exceptional heart care, we have created designated Provider Care Teams.  These Care Teams include your primary Cardiologist (physician) and Advanced Practice Providers (APPs -  Physician Assistants and Nurse Practitioners) who all work together to provide you with the care you need, when you need it.  We recommend signing up for the patient portal called "MyChart".  Sign up information is provided on this After Visit Summary.  MyChart is used to connect with patients for Virtual Visits (Telemedicine).  Patients are able to view lab/test results, encounter notes, upcoming appointments, etc.  Non-urgent messages can be sent to your provider as well.   To learn more about what you can do with MyChart, go to [email or web address].    Your next appointment:   1 year(s)  Provider:   DR. Johney Frame

## 2023-01-12 ENCOUNTER — Other Ambulatory Visit: Payer: Self-pay | Admitting: Obstetrics & Gynecology

## 2023-02-01 ENCOUNTER — Ambulatory Visit: Payer: BC Managed Care – PPO | Attending: Cardiology

## 2023-02-01 DIAGNOSIS — E7849 Other hyperlipidemia: Secondary | ICD-10-CM

## 2023-02-01 DIAGNOSIS — Z79899 Other long term (current) drug therapy: Secondary | ICD-10-CM

## 2023-02-01 DIAGNOSIS — Z8249 Family history of ischemic heart disease and other diseases of the circulatory system: Secondary | ICD-10-CM

## 2023-02-01 DIAGNOSIS — E785 Hyperlipidemia, unspecified: Secondary | ICD-10-CM

## 2023-02-02 ENCOUNTER — Telehealth: Payer: Self-pay | Admitting: *Deleted

## 2023-02-02 DIAGNOSIS — Z8249 Family history of ischemic heart disease and other diseases of the circulatory system: Secondary | ICD-10-CM

## 2023-02-02 DIAGNOSIS — E785 Hyperlipidemia, unspecified: Secondary | ICD-10-CM

## 2023-02-02 DIAGNOSIS — Z79899 Other long term (current) drug therapy: Secondary | ICD-10-CM

## 2023-02-02 LAB — LIPID PANEL
Chol/HDL Ratio: 3.1 ratio (ref 0.0–4.4)
Cholesterol, Total: 205 mg/dL — ABNORMAL HIGH (ref 100–199)
HDL: 67 mg/dL (ref >39)
LDL Chol Calc (NIH): 89 mg/dL (ref 0–99)
Triglycerides: 301 mg/dL — ABNORMAL HIGH (ref 0–149)
VLDL Cholesterol Cal: 49 mg/dL — ABNORMAL HIGH (ref 5–40)

## 2023-02-02 NOTE — Telephone Encounter (Signed)
Repeat lipids in 3 months scheduled for the pt on 05/04/23.   She is aware to come fasting to this lab appt.   Pt verbalized understanding and agrees with this plan.

## 2023-02-02 NOTE — Telephone Encounter (Signed)
-----   Message from Freada Bergeron, MD sent at 02/02/2023 11:42 AM EDT ----- Rhonda Drake. We can repeat in 3 months to make sure it is better controlled.  ----- Message ----- From: Nuala Alpha, LPN Sent: [phone]  11:03 AM EDT To: Freada Bergeron, MD  FYI- she was fasting but went to a Easter party day before where she consumed large amounts of sweets and other bad food choices  ----- Message ----- From: Freada Bergeron, MD Sent: 02/02/2023   8:59 AM EDT To: Nuala Alpha, LPN  Her LDL cholesterol looks good (goal <100), but her TG are elevated to 301. Was she fasting or did she eat something before her testing?

## 2023-02-22 ENCOUNTER — Other Ambulatory Visit: Payer: Self-pay

## 2023-02-22 MED ORDER — ATORVASTATIN CALCIUM 10 MG PO TABS: 10.0000 mg | ORAL_TABLET | Freq: Every day | ORAL | 3 refills | Status: DC

## 2023-03-10 ENCOUNTER — Other Ambulatory Visit: Payer: Self-pay

## 2023-03-10 MED ORDER — METOPROLOL SUCCINATE ER 50 MG PO TB24: 50.0000 mg | ORAL_TABLET | Freq: Every day | ORAL | 3 refills | Status: DC

## 2023-05-04 ENCOUNTER — Ambulatory Visit: Payer: BC Managed Care – PPO | Attending: Cardiology

## 2023-05-04 DIAGNOSIS — E785 Hyperlipidemia, unspecified: Secondary | ICD-10-CM

## 2023-05-04 DIAGNOSIS — Z8249 Family history of ischemic heart disease and other diseases of the circulatory system: Secondary | ICD-10-CM

## 2023-05-04 DIAGNOSIS — Z79899 Other long term (current) drug therapy: Secondary | ICD-10-CM

## 2023-05-05 LAB — LIPID PANEL
Chol/HDL Ratio: 2.3 ratio (ref 0.0–4.4)
Cholesterol, Total: 203 mg/dL — ABNORMAL HIGH (ref 100–199)
HDL: 88 mg/dL (ref >39)
LDL Chol Calc (NIH): 88 mg/dL (ref 0–99)
Triglycerides: 163 mg/dL — ABNORMAL HIGH (ref 0–149)
VLDL Cholesterol Cal: 27 mg/dL (ref 5–40)

## 2023-06-14 IMAGING — CT CT CARDIAC CORONARY ARTERY CALCIUM SCORE
3 series · 14 of 20 positions shown, 16 images · non-contrast
Comparison: None.
COMPARISON: None.

Addendum:
EXAM:
OVER-READ INTERPRETATION  CT CHEST

The following report is an over-read performed by radiologist Dr.
Adlai Shy [REDACTED] on 08/20/2021. This
over-read does not include interpretation of cardiac or coronary
anatomy or pathology. The coronary calcium score interpretation by
the cardiologist is attached.
CLINICAL DATA: Cardiovascular Disease Risk stratification
Coronary Calcium Score
TECHNIQUE: A gated, non-contrast computed tomography scan of the heart was
performed using 3mm slice thickness. Axial images were analyzed on a
dedicated workstation. Calcium scoring of the coronary arteries was
performed using the Agatston method.

[Series 2: cascseq 2.0 sa36 70% (id) · axial · 0.39mm/px · z∈[-207,-137]mm · 4 of 59 slices shown]
[im 12/59  vessel]
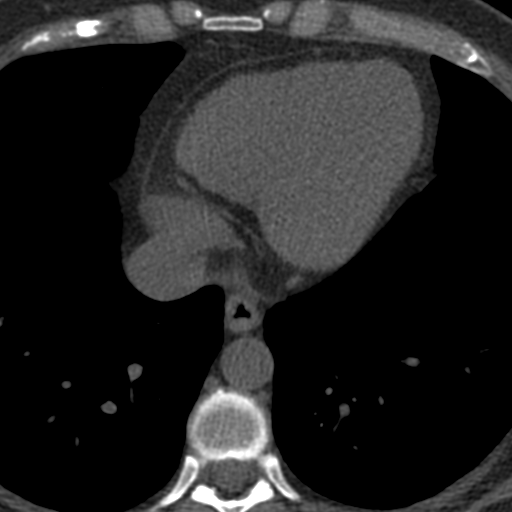
[im 24/59  vessel]
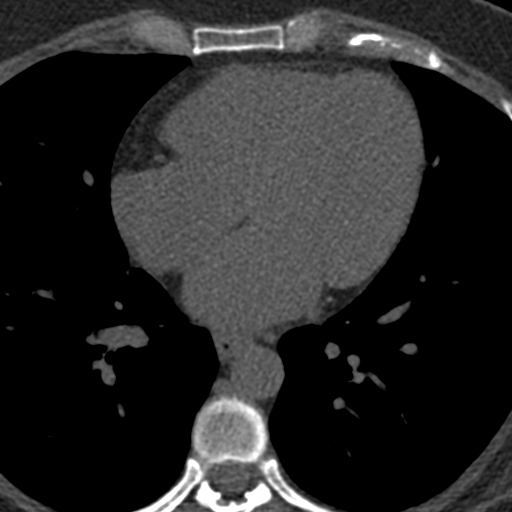
[im 35/59  vessel]
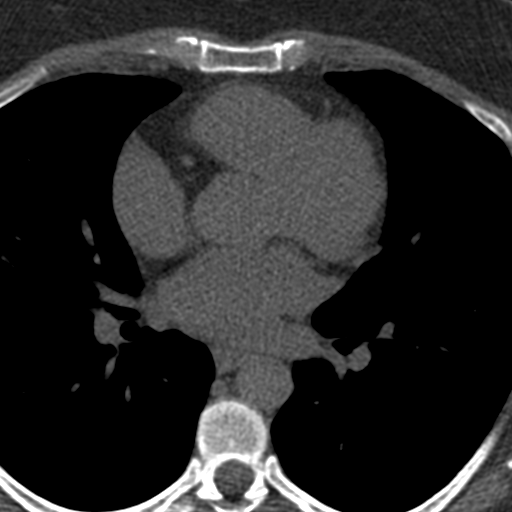
[im 47/59  vessel]
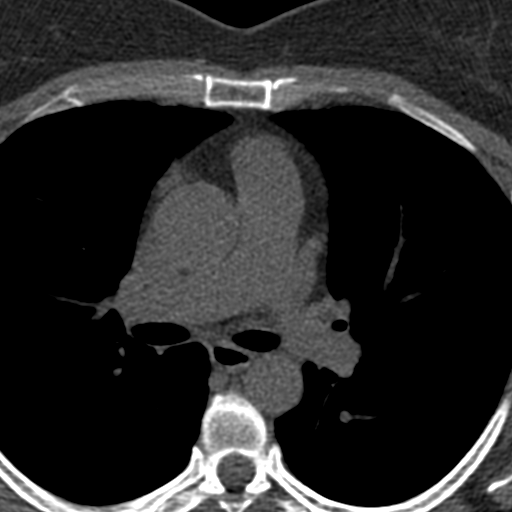

[Series 3: cascseq 2.0 bf37 st · axial · 0.68mm/px · z∈[-211,-133]mm · 5 of 59 slices shown, 7 images]
[im 10/59  vessel]
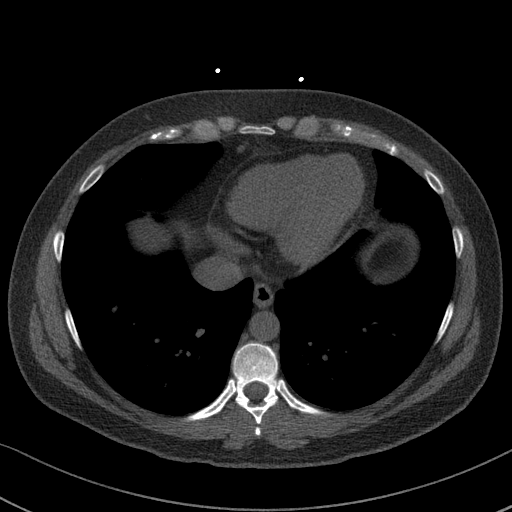
[im 10/59  lung]
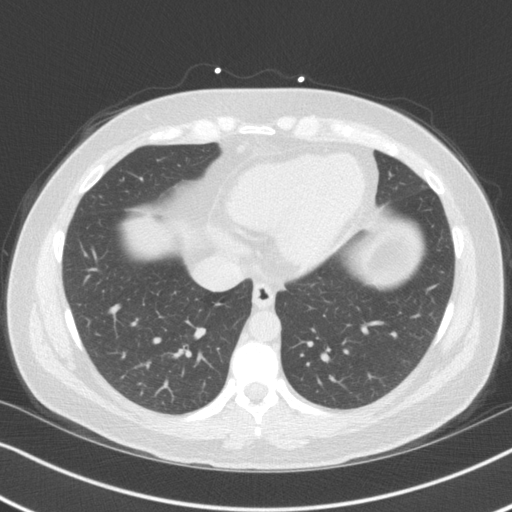
[im 20/59  vessel]
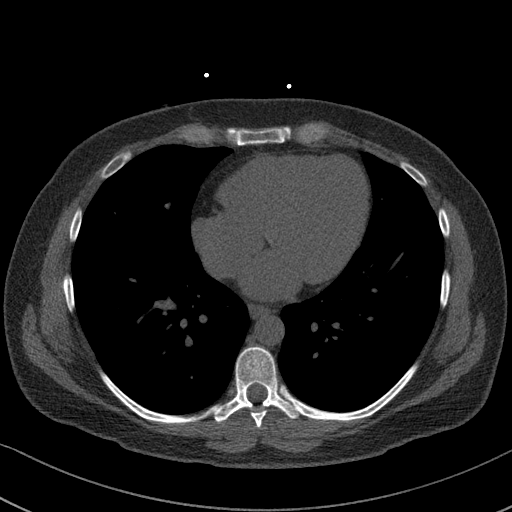
[im 30/59  vessel]
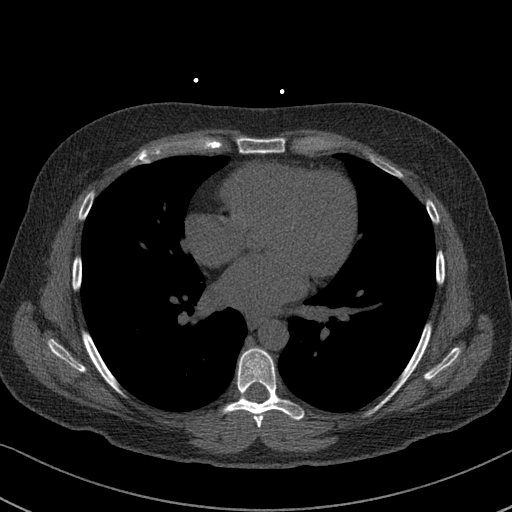
[im 39/59  vessel]
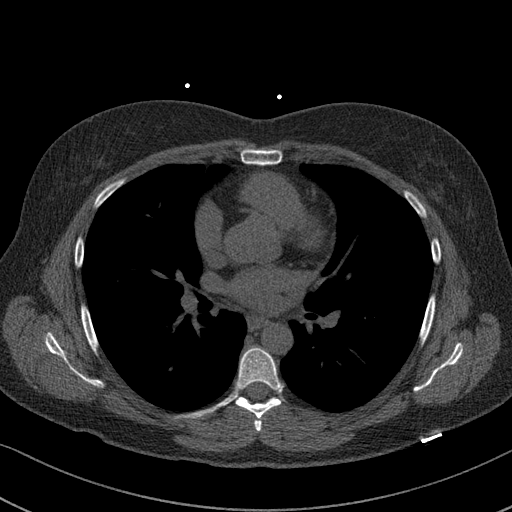
[im 49/59  vessel]
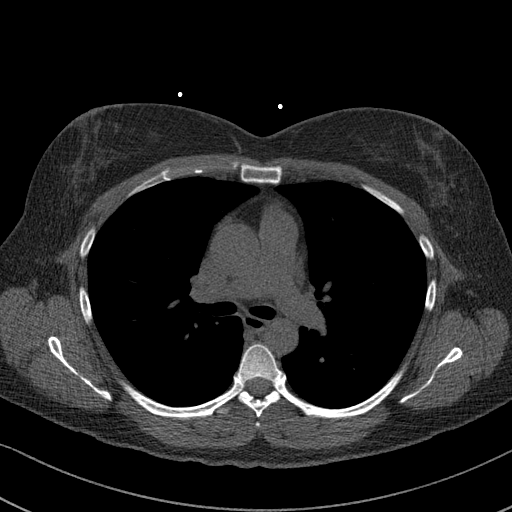
[im 49/59  lung]
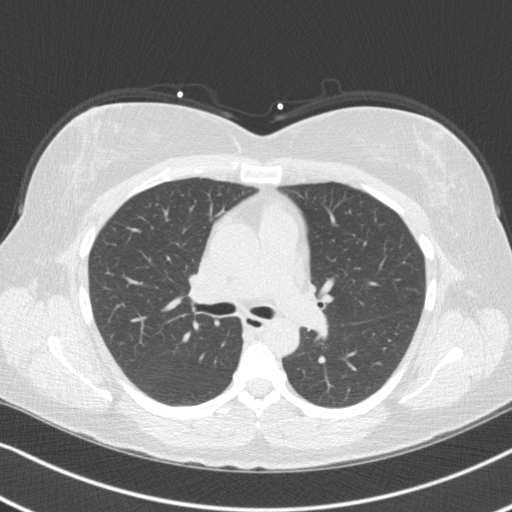

[Series 4: cascseq 2.0 br59 lung · axial · 0.68mm/px · z∈[-211,-133]mm · 5 of 59 slices shown]
[im 10/59  lung]
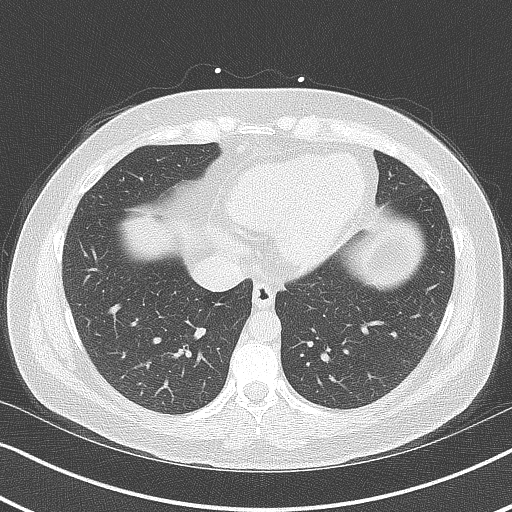
[im 20/59  lung]
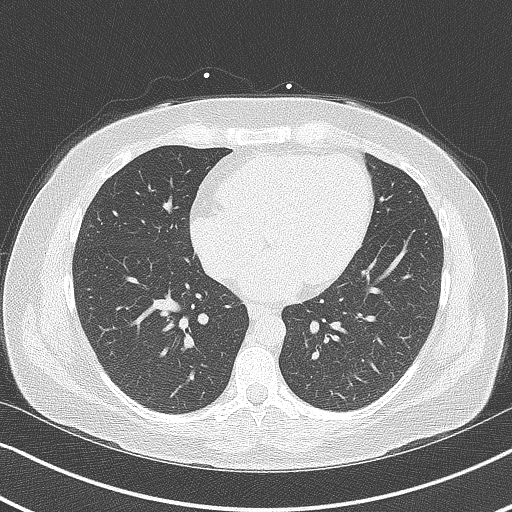
[im 30/59  lung]
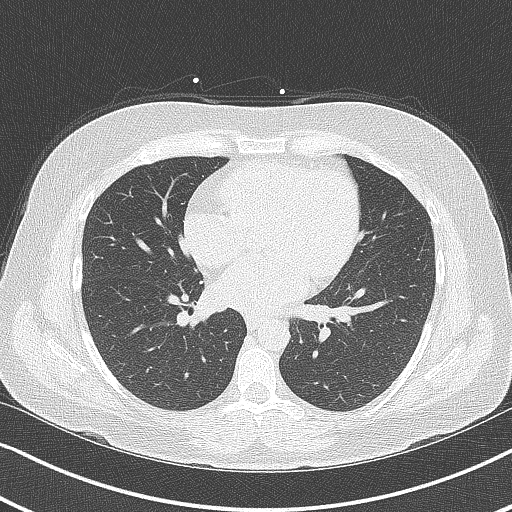
[im 39/59  lung]
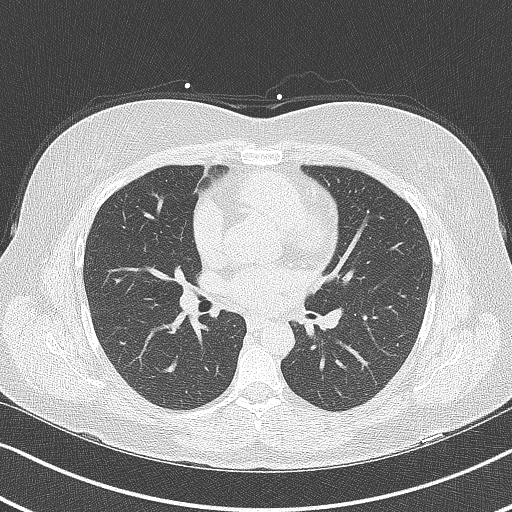
[im 49/59  lung]
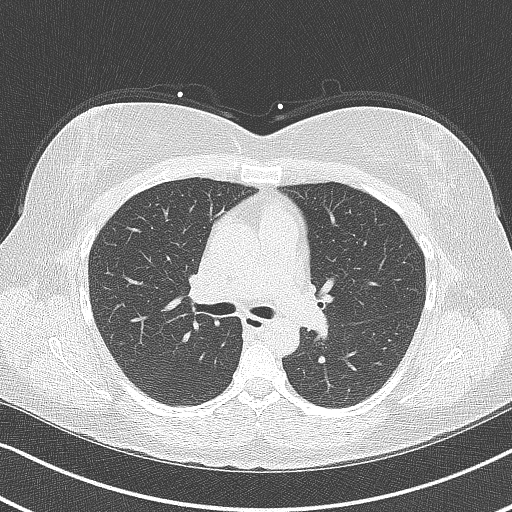

[14 of 20 positions shown; findings below may reference images not displayed]

FINDINGS: Within the visualized portions of the thorax there are no suspicious
appearing pulmonary nodules or masses, there is no acute
consolidative airspace disease, no pleural effusions, no
pneumothorax and no lymphadenopathy. Visualized portions of the
upper abdomen are unremarkable. There are no aggressive appearing
lytic or blastic lesions noted in the visualized portions of the
skeleton.
IMPRESSION: 1. No significant incidental noncardiac findings are noted.
FINDINGS: Coronary arteries: Normal origins.

Coronary Calcium Score:

Left main: 0

Left anterior descending artery: 0

Left circumflex artery: 0

Right coronary artery: 0

Total: 0

Percentile: 0

Pericardium: Normal.

Ascending Aorta: Normal caliber.

Non-cardiac: See separate report from [REDACTED].
IMPRESSION: Coronary calcium score of 0. This was 0 percentile for age-, race-,
and sex-matched controls.



If CAC=0, it is reasonable to withhold statin therapy and reassess
in 5 to 10 years, as long as higher risk conditions are absent
(diabetes mellitus, family history of premature CHD in first degree
relatives (males <55 years; females <65 years), cigarette smoking,
or LDL >=190 mg/dL).

If CAC is 1 to 99, it is reasonable to initiate statin therapy for
patients >=55 years of age.

If CAC is >=100 or >=75th percentile, it is reasonable to initiate
statin therapy at any age.

Cardiology referral should be considered for patients with CAC
scores >=400 or >=75th percentile.

*9092 AHA/ACC/AACVPR/AAPA/ABC/RENOME NEKRETNINE/REMIZA/CHAI/Frans Erry/TIBBY/ROMANE/ANONIMI
Guideline on the Management of Blood Cholesterol: A Report of the
American College of Cardiology/American Heart Association Task Force
on Clinical Practice Guidelines. J Am Coll Cardiol.
5206;73(24):0647-0210.

*** End of Addendum ***
EXAM:
OVER-READ INTERPRETATION  CT CHEST

The following report is an over-read performed by radiologist Dr.
Adlai Shy [REDACTED] on 08/20/2021. This
over-read does not include interpretation of cardiac or coronary
anatomy or pathology. The coronary calcium score interpretation by
the cardiologist is attached.
FINDINGS: Within the visualized portions of the thorax there are no suspicious
appearing pulmonary nodules or masses, there is no acute
consolidative airspace disease, no pleural effusions, no
pneumothorax and no lymphadenopathy. Visualized portions of the
upper abdomen are unremarkable. There are no aggressive appearing
lytic or blastic lesions noted in the visualized portions of the
skeleton.
IMPRESSION: 1. No significant incidental noncardiac findings are noted.

## 2023-12-07 ENCOUNTER — Ambulatory Visit: Payer: BC Managed Care – PPO | Admitting: Obstetrics and Gynecology

## 2023-12-15 ENCOUNTER — Other Ambulatory Visit: Payer: Self-pay | Admitting: Obstetrics & Gynecology

## 2023-12-15 NOTE — Telephone Encounter (Signed)
Medication refill request: Rhonda Drake Last AEX:  12-04-22 Next AEX: 12-22-23 Last MMG (if hormonal medication request): 07-02-23 Refill authorized: please approve if appropriate

## 2023-12-22 ENCOUNTER — Ambulatory Visit (INDEPENDENT_AMBULATORY_CARE_PROVIDER_SITE_OTHER): Payer: 59 | Admitting: Obstetrics and Gynecology

## 2023-12-22 ENCOUNTER — Encounter: Payer: Self-pay | Admitting: Obstetrics and Gynecology

## 2023-12-22 ENCOUNTER — Other Ambulatory Visit (HOSPITAL_COMMUNITY)
Admission: RE | Admit: 2023-12-22 | Discharge: 2023-12-22 | Disposition: A | Discharge status: Home / Self Care | Payer: 59 | Source: Ambulatory Visit | Attending: Obstetrics and Gynecology | Admitting: Obstetrics and Gynecology

## 2023-12-22 VITALS — BP 128/88 | HR 91 | Ht 63.75 in | Wt 182.0 lb

## 2023-12-22 DIAGNOSIS — Z1211 Encounter for screening for malignant neoplasm of colon: Secondary | ICD-10-CM

## 2023-12-22 DIAGNOSIS — Z01419 Encounter for gynecological examination (general) (routine) without abnormal findings: Secondary | ICD-10-CM | POA: Diagnosis present

## 2023-12-22 DIAGNOSIS — Z1231 Encounter for screening mammogram for malignant neoplasm of breast: Secondary | ICD-10-CM

## 2023-12-22 DIAGNOSIS — Z1331 Encounter for screening for depression: Secondary | ICD-10-CM | POA: Diagnosis not present

## 2023-12-22 DIAGNOSIS — N951 Menopausal and female climacteric states: Secondary | ICD-10-CM | POA: Diagnosis not present

## 2023-12-22 MED ORDER — INTRAROSA 6.5 MG VA INST: 1.0000 | VAGINAL_INSERT | Freq: Every evening | VAGINAL | 12 refills | Status: DC | PRN

## 2023-12-22 NOTE — Progress Notes (Unsigned)
48 y.o. y.o. female here for annual exam. Patient's last menstrual period was 12/19/2023 (exact date). Period Cycle (Days):  (1-3) Period Duration (Days): 28 Menstrual Flow: Light Menstrual Control: Panty liner Dysmenorrhea: (!) Mild Dysmenorrhea Symptoms: Cramping  Last pap smear ASCUS/HPV Neg 2022. Repeat 12/21/22  Last mammogram: 07/02/23 Last colonoscopy: 02/16/10 Body mass index is 31.49 kg/m. Dxa: 5 years of steroid use. To get baseline dxa for arthritis in hand    12/22/2023    3:07 PM  Depression screen PHQ 2/9  Decreased Interest 0  Down, Depressed, Hopeless 0  PHQ - 2 Score 0    Blood pressure 128/88, pulse 91, height 5' 3.75" (1.619 m), weight 182 lb (82.6 kg), last menstrual period 12/19/2023, SpO2 96%.  No results found for: "DIAGPAP", "HPVHIGH", "ADEQPAP"  GYN HISTORY: No results found for: "DIAGPAP", "HPVHIGH", "ADEQPAP"  OB History  Gravida Para Term Preterm AB Living  2 2    2   SAB IAB Ectopic Multiple Live Births          # Outcome Date GA Lbr Len/2nd Weight Sex Type Anes PTL Lv  2 Para           1 Para             Past Medical History:  Diagnosis Date   Arthritis    Hyperlipidemia    Irritable bowel syndrome    Mild allergic rhinitis    Palpitations    Rapid heart beat     Past Surgical History:  Procedure Laterality Date   CESAREAN SECTION     x2   WISDOM TOOTH EXTRACTION      Current Outpatient Medications on File Prior to Visit  Medication Sig Dispense Refill   atorvastatin (LIPITOR) 10 MG tablet Take 1 tablet (10 mg total) by mouth daily. 90 tablet 3   Brimonidine Tartrate (LUMIFY) 0.025 % SOLN 1 drop into affected eye  as needed Ophthalmic every 8 hrs     fexofenadine (ALLEGRA ALLERGY) 180 MG tablet 1 tablet Swallow whole with water; do not take with fruit juices. Orally Once a day     fluticasone (FLONASE) 50 MCG/ACT nasal spray Place into both nostrils daily.     metoprolol succinate (TOPROL-XL) 50 MG 24 hr tablet Take 1  tablet (50 mg total) by mouth daily. 90 tablet 3   montelukast (SINGULAIR) 10 MG tablet Take 10 mg by mouth daily.     [name] 3-0.02 MG tablet TAKE 1 TABLET BY MOUTH EVERY DAY 84 tablet 0   Olopatadine HCl 0.6 % SOLN olopatadine 0.6 % nasal spray  USE 2 SPRAYS IN EACH NOSTRIL TWICE A DAY     Probiotic Product (PROBIOTIC-10 PO) Take by mouth.     No current facility-administered medications on file prior to visit.    Social History   Socioeconomic History   Marital status: Married    Spouse name: Not on file   Number of children: 2   Years of education: Not on file   Highest education level: Not on file  Occupational History   Occupation: ART TEACHER  Tobacco Use   Smoking status: Never    Passive exposure: Never   Smokeless tobacco: Never  Vaping Use   Vaping status: Never Used  Substance and Sexual Activity   Alcohol use: Yes    Comment: social    Drug use: Never   Sexual activity: Yes    Partners: Male    Birth control/protection: Pill    Comment:  1st intercourse- 22, partners- 5, married- 14 yrs   Other Topics Concern   Not on file  Social History Narrative   Not on file   Social Drivers of Health   Financial Resource Strain: Not on file  Food Insecurity: Not on file  Transportation Needs: Not on file  Physical Activity: Not on file  Stress: Not on file  Social Connections: Not on file  Intimate Partner Violence: Not on file    Family History  Problem Relation Age of Onset   Hypertension Mother    Hypercholesterolemia Mother    Heart attack Father 32   Cancer Father        sarcoma    Hypertension Father    Heart disease Father    Hypercholesterolemia Father      Allergies  Allergen Reactions   Penicillins    Sulfa Antibiotics Hives      Patient's last menstrual period was Patient's last menstrual period was 12/19/2023 (exact date)..            Review of Systems Alls systems reviewed and are negative.     Physical Exam Constitutional:       Appearance: Normal appearance.  Genitourinary:     Vulva and urethral meatus normal.     No lesions in the vagina.     Right Labia: No rash, lesions or skin changes.    Left Labia: No lesions, skin changes or rash.    No vaginal discharge or tenderness.     No vaginal prolapse present.    No vaginal atrophy present.     Right Adnexa: not tender, not palpable and no mass present.    Left Adnexa: not tender, not palpable and no mass present.    No cervical motion tenderness or discharge.     Uterus is not enlarged, tender or irregular.  Breasts:    Right: Normal.     Left: Normal.  HENT:     Head: Normocephalic.  Neck:     Thyroid: No thyroid mass, thyromegaly or thyroid tenderness.  Cardiovascular:     Rate and Rhythm: Normal rate and regular rhythm.     Heart sounds: Normal heart sounds, S1 normal and S2 normal.  Pulmonary:     Effort: Pulmonary effort is normal.     Breath sounds: Normal breath sounds and air entry.  Abdominal:     General: There is no distension.     Palpations: Abdomen is soft. There is no mass.     Tenderness: There is no abdominal tenderness. There is no guarding or rebound.  Musculoskeletal:        General: Normal range of motion.     Cervical back: Full passive range of motion without pain, normal range of motion and neck supple. No tenderness.     Right lower leg: No edema.     Left lower leg: No edema.  Neurological:     Mental Status: She is alert.  Skin:    General: Skin is warm.  Psychiatric:        Mood and Affect: Mood normal.        Behavior: Behavior normal.        Thought Content: Thought content normal.  Vitals and nursing note reviewed. Exam conducted with a chaperone present.       A:         Well Woman GYN exam  P:        Pap smear collected today Encouraged annual mammogram screening Colon cancer screening referral placed today DXA ordered today due to risks with longstanding steroid use and  family history of osteo Labs and immunizations to do with PMD Discussed breast self exams Encouraged healthy lifestyle practices Encouraged Vit D and Calcium    No follow-ups on file.  Earley Favor

## 2023-12-27 ENCOUNTER — Telehealth: Payer: Self-pay | Admitting: Cardiology

## 2023-12-27 DIAGNOSIS — E785 Hyperlipidemia, unspecified: Secondary | ICD-10-CM

## 2023-12-27 LAB — CYTOLOGY - PAP
Comment: NEGATIVE
Diagnosis: UNDETERMINED — AB
High risk HPV: NEGATIVE

## 2023-12-27 NOTE — Telephone Encounter (Signed)
 Patient had an appt tomorrow with Dr. Tenny Craw but that had to be cancelled due to provider being out. She is rescheduled with Dr. Odis Hollingshead on 03/25 but still wants to come in tomorrow to get lab work done but there are no orders. Please advise.

## 2023-12-27 NOTE — Telephone Encounter (Signed)
 Spoke with patient and she would like to get labs before her appointment in March.    Former Dr. Shari Prows patient.

## 2023-12-28 ENCOUNTER — Ambulatory Visit: Payer: 59 | Admitting: Internal Medicine

## 2023-12-28 NOTE — Telephone Encounter (Signed)
 Check Fasting lipids, direct LDL, Lpa, CMP atleast 48hrs before the office visit.  Please order and release the labs.  Rhonda Drake [place], DO, [facility]

## 2023-12-31 ENCOUNTER — Telehealth: Payer: Self-pay

## 2023-12-31 NOTE — Telephone Encounter (Signed)
 Pt LVM in triage line stating that she has questions regarding last appt and calls that she has been receiving from pharmacy, [facility], and some others and would just like some guidance/clarity as to what she should be doing or what was recommended. Requesting a CB.

## 2024-01-03 NOTE — Telephone Encounter (Signed)
 Spoke with patient. Patient expressed concerns about AEX 12/22/23.   Requesting 12/22/23 PAP recommendations and hormone results. Advised of PAP results, will f/u with recommendations once reviewed. Advised blood not collected, labs were ordered. Patient states she was advised everything needed for hormones would come from PAP, was not aware blood needed. Advised will need to return for lab appt for labs to be collected.  2.  Patient reports [facility] calling to schedule   screening, patient goes to [facility], screening not due until 06/2024. Patient aware no order needed for screening, can contact [facility] to schedule. MMG order CX.  3. Patient states BMD ordered, states this was discussed during OV and declined. Patient uncertain why this was still ordered.  4. Patient states pharmacy has been contacting her for Rx to be filled, states no Rx discussed during 12/22/23 visit. Per review, Rx for intrarosa was sent to [facility] and [facility] on 12/22/23. Advised patient I will contact pharmacy to discontinue.    Advised patient I will f/u with Dr. Karma Greaser and f/u with recommendations. Patient appreciative of call.    Call placed to [facility], spoke with Minta Balsam Rx cancelled.   Dr. Karma Greaser -please review and advise on plan of care.

## 2024-01-03 NOTE — Telephone Encounter (Signed)
 Please advise on PAP and BMD order.

## 2024-01-04 ENCOUNTER — Encounter: Payer: Self-pay | Admitting: Obstetrics and Gynecology

## 2024-01-05 ENCOUNTER — Other Ambulatory Visit: Payer: 59

## 2024-01-05 LAB — ESTRADIOL: Estradiol: <15 pg/mL

## 2024-01-05 LAB — VITAMIN D 25 HYDROXY (VIT D DEFICIENCY, FRACTURES): Vit D, 25-Hydroxy: 52 ng/mL (ref 30–100)

## 2024-01-05 LAB — FOLLICLE STIMULATING HORMONE: FSH: 1.8 m[IU]/mL

## 2024-01-06 ENCOUNTER — Encounter: Payer: Self-pay | Admitting: Obstetrics and Gynecology

## 2024-01-06 NOTE — Telephone Encounter (Signed)
 Pt does not want any treatment

## 2024-01-07 NOTE — Telephone Encounter (Signed)
 Labs done on 01/05/2024 and DXA order cx. Encounter closed.

## 2024-01-26 LAB — COMPREHENSIVE METABOLIC PANEL
ALT: 34 IU/L — ABNORMAL HIGH (ref 0–32)
AST: 23 IU/L (ref 0–40)
Albumin: 4.8 g/dL (ref 3.9–4.9)
Alkaline Phosphatase: 89 IU/L (ref 44–121)
BUN/Creatinine Ratio: 11 (ref 9–23)
BUN: 9 mg/dL (ref 6–24)
Bilirubin Total: 0.5 mg/dL (ref 0.0–1.2)
CO2: 21 mmol/L (ref 20–29)
Calcium: 10.1 mg/dL (ref 8.7–10.2)
Chloride: 101 mmol/L (ref 96–106)
Creatinine, Ser: 0.85 mg/dL (ref 0.57–1.00)
Globulin, Total: 2.3 g/dL (ref 1.5–4.5)
Glucose: 97 mg/dL (ref 70–99)
Potassium: 4.2 mmol/L (ref 3.5–5.2)
Sodium: 137 mmol/L (ref 134–144)
Total Protein: 7.1 g/dL (ref 6.0–8.5)
eGFR: 85 mL/min/{1.73_m2} (ref >59)

## 2024-01-26 LAB — LIPID PANEL
Chol/HDL Ratio: 2.7 ratio (ref 0.0–4.4)
Cholesterol, Total: 205 mg/dL — ABNORMAL HIGH (ref 100–199)
HDL: 76 mg/dL (ref >39)
LDL Chol Calc (NIH): 98 mg/dL (ref 0–99)
Triglycerides: 184 mg/dL — ABNORMAL HIGH (ref 0–149)
VLDL Cholesterol Cal: 31 mg/dL (ref 5–40)

## 2024-01-26 LAB — LDL CHOLESTEROL, DIRECT: LDL Direct: 102 mg/dL — ABNORMAL HIGH (ref 0–99)

## 2024-01-26 LAB — LIPOPROTEIN A (LPA)

## 2024-01-27 ENCOUNTER — Encounter: Payer: Self-pay | Admitting: Cardiology

## 2024-02-01 ENCOUNTER — Ambulatory Visit: Payer: 59 | Attending: Cardiology | Admitting: Cardiology

## 2024-02-01 ENCOUNTER — Encounter: Payer: Self-pay | Admitting: Cardiology

## 2024-02-01 ENCOUNTER — Ambulatory Visit: Payer: 59 | Admitting: Cardiology

## 2024-02-01 VITALS — BP 120/84 | HR 83 | Resp 16 | Ht 63.0 in | Wt 180.8 lb

## 2024-02-01 DIAGNOSIS — Z8249 Family history of ischemic heart disease and other diseases of the circulatory system: Secondary | ICD-10-CM

## 2024-02-01 DIAGNOSIS — R002 Palpitations: Secondary | ICD-10-CM | POA: Diagnosis not present

## 2024-02-01 DIAGNOSIS — E785 Hyperlipidemia, unspecified: Secondary | ICD-10-CM | POA: Diagnosis not present

## 2024-02-01 NOTE — Patient Instructions (Signed)

## 2024-02-01 NOTE — Progress Notes (Signed)
 Cardiology Office Note:  .   Date:  02/01/2024  ID:  Rhonda Drake, DOB November 21, 1975, MRN [id number] PCP:  Jackelyn Poling, DO  Former Cardiology Providers: Dr. Lowella [facility] Providers Cardiologist:  Tessa Lerner, DO , [facility] (established care 02/01/24) Electrophysiologist:  None  Click to update primary MD,subspecialty MD or APP then REFRESH:1}    Chief Complaint  Patient presents with   Hyperlipidemia   Family history of early CAD    Follow-up    History of Present Illness: .   Rhonda Drake is a 48 y.o. Caucasian female whose past medical history and cardiovascular risk factors includes: Hyperlipidemia, IBS, palpitations.  Formally under the care of Dr. Shari Prows who last saw Rhonda Drake back in February 2024. I am seeing her for the first time to re-establishing care.   Since establishing care patient has undergone cardiovascular workup including a coronary calcium score, echo, and a cardiac monitor results reviewed and noted below for further reference.  In the past her palpitations have been well-controlled with metoprolol and she presents today for 1 year follow-up visit.  Stable from a cardiovascular standpoint.  Denies anginal chest pain or heart failure symptoms.  Overall function capacity remains stable.  Most recent labs from 01/25/2024 independently reviewed.  Lipids overall improved compared to prior but triglycerides are still not at goal.  Patient states that she is a part of a wine club and that usually have at least 4 drinks over the weekend.  And she enjoys cooking/baking soda triglycerides could be coming from intake of desserts and increase carbohydrate in the diet.  Review of Systems: .   Review of Systems  Cardiovascular:  Negative for chest pain, claudication, irregular heartbeat, leg swelling, near-syncope, orthopnea, palpitations, paroxysmal nocturnal dyspnea and syncope.  Respiratory:  Negative for shortness of breath.    Hematologic/Lymphatic: Negative for bleeding problem.    Studies Reviewed:   EKG: EKG Interpretation Date/Time:  Tuesday February 01 2024 08:10:51 EDT Ventricular Rate:  76 PR Interval:  170 QRS Duration:  76 QT Interval:  380 QTC Calculation: 427 R Axis:   51  Text Interpretation: Normal sinus rhythm Normal ECG No previous ECGs available Confirmed by Tessa Lerner [phone]) on 02/01/2024 8:15:52 AM  Echocardiogram: 01/2018: LVEF 60%, mild LVH, trace MR/TR/PR  Coronary calcium score 08/2021: Total score 0  RADIOLOGY: NA  Risk Assessment/Calculations:   The 10-year ASCVD risk score (Arnett DK, et al., 2019) is: 0.5%   Values used to calculate the score:     Age: 68 years     Sex: Female     Is Non-Hispanic African American: No     Diabetic: No     Tobacco smoker: No     Systolic Blood Pressure: 120 mmHg     Is BP treated: No     HDL Cholesterol: 76 mg/dL     Total Cholesterol: 205 mg/dL  Labs:       Latest Ref Rng & Units 08/13/2021    8:33 AM 04/24/2020   10:19 AM 05/09/2019   10:30 AM  CBC  WBC 3.4 - 10.8 x10E3/uL 7.1  9.1  7.6   Hemoglobin 11.1 - 15.9 g/dL 21.3  08.6  57.8   Hematocrit 34.0 - 46.6 % 43.0  43.3  42.7   Platelets 150 - 450 x10E3/uL 252  244  228        Latest Ref Rng & Units 01/25/2024    7:34 AM 08/13/2021    8:33 AM  04/24/2020   10:19 AM  BMP  Glucose 70 - 99 mg/dL 97  95  89   BUN 6 - 24 mg/dL 9  8  10    Creatinine 0.57 - 1.00 mg/dL 2.13  0.86  5.78   BUN/Creat Ratio 9 - 23 11  10  12    Sodium 134 - 144 mmol/L 137  140  137   Potassium 3.5 - 5.2 mmol/L 4.2  4.5  4.2   Chloride 96 - 106 mmol/L 101  102  102   CO2 20 - 29 mmol/L 21  22  22    Calcium 8.7 - 10.2 mg/dL 46.9  62.9  9.9       Latest Ref Rng & Units 01/25/2024    7:34 AM 08/13/2021    8:33 AM 04/24/2020   10:19 AM  CMP  Glucose 70 - 99 mg/dL 97  95  89   BUN 6 - 24 mg/dL 9  8  10    Creatinine 0.57 - 1.00 mg/dL 5.28  4.13  2.44   Sodium 134 - 144 mmol/L 137  140  137    Potassium 3.5 - 5.2 mmol/L 4.2  4.5  4.2   Chloride 96 - 106 mmol/L 101  102  102   CO2 20 - 29 mmol/L 21  22  22    Calcium 8.7 - 10.2 mg/dL 01.0  27.2  9.9   Total Protein 6.0 - 8.5 g/dL 7.1  7.1    Total Bilirubin 0.0 - 1.2 mg/dL 0.5  0.6    Alkaline Phos 44 - 121 IU/L 89  75    AST 0 - 40 IU/L 23  18    ALT 0 - 32 IU/L 34  22      Lab Results  Component Value Date   CHOL 205 (H) 01/25/2024   HDL 76 01/25/2024   LDLCALC 98 01/25/2024   LDLDIRECT 102 (H) 01/25/2024   TRIG 184 (H) 01/25/2024   CHOLHDL 2.7 01/25/2024   Recent Labs    01/25/24 0734  LIPOA <8.4   No components found for: "NTPROBNP" No results for input(s): "PROBNP" in the last 8760 hours. No results for input(s): "TSH" in the last 8760 hours.  Physical Exam:    Today's Vitals   02/01/24 0806  BP: 120/84  Pulse: 83  Resp: 16  SpO2: 99%  Weight: 180 lb 12.8 oz (82 kg)  Height: 5\' 3"  (1.6 m)   Body mass index is 32.03 kg/m. Wt Readings from Last 3 Encounters:  02/01/24 180 lb 12.8 oz (82 kg)  12/22/23 182 lb (82.6 kg)  12/23/22 180 lb 9.6 oz (81.9 kg)    Physical Exam  Constitutional: No distress.  hemodynamically stable  Neck: No JVD present.  Cardiovascular: Normal rate, regular rhythm, S1 normal and S2 normal. Exam reveals no gallop, no S3 and no S4.  No murmur heard. Pulmonary/Chest: Effort normal and breath sounds normal. No stridor. She has no wheezes. She has no rales.  Musculoskeletal:        General: No edema.     Cervical back: Neck supple.  Skin: Skin is warm.     Impression & Recommendation(s):  Impression:   ICD-10-CM   1. Palpitations  R00.2     2. Hyperlipidemia, unspecified hyperlipidemia type  E78.5     3. Family history of early CAD  Z82.49 EKG 12-Lead       Recommendation(s):  Palpitations Chronic, stable. Continue Toprol-XL 50 mg p.o. daily  Hyperlipidemia, unspecified  hyperlipidemia type Family history of early CAD Currently on atorvastatin 10 mg p.o.  daily. Most recent labs from 01/25/2024 independently reviewed-LDL 102 mg/dL and triglycerides 161 mg/dL, LP(a) within normal limits Reemphasized importance of no more than 1 alcoholic beverage per day and also reducing carbohydrate intake in the diet. ASCVD risk score is 0.5%. In total coronary calcium score in the past was 0. Continue current medical therapy with focus on improving her modifiable cardiovascular risk factors  Orders Placed:  Orders Placed This Encounter  Procedures   EKG 12-Lead     Final Medication List:   No orders of the defined types were placed in this encounter.   There are no discontinued medications.   Current Outpatient Medications:    atorvastatin (LIPITOR) 10 MG tablet, Take 1 tablet (10 mg total) by mouth daily., Disp: 90 tablet, Rfl: 3   Brimonidine Tartrate (LUMIFY) 0.025 % SOLN, 1 drop into affected eye  as needed Ophthalmic every 8 hrs, Disp: , Rfl:    fexofenadine (ALLEGRA ALLERGY) 180 MG tablet, 1 tablet Swallow whole with water; do not take with fruit juices. Orally Once a day, Disp: , Rfl:    fluticasone (FLONASE) 50 MCG/ACT nasal spray, Place into both nostrils daily., Disp: , Rfl:    metoprolol succinate (TOPROL-XL) 50 MG 24 hr tablet, Take 1 tablet (50 mg total) by mouth daily., Disp: 90 tablet, Rfl: 3   montelukast (SINGULAIR) 10 MG tablet, Take 10 mg by mouth daily., Disp: , Rfl:    [name] 3-0.02 MG tablet, TAKE 1 TABLET BY MOUTH EVERY DAY, Disp: 84 tablet, Rfl: 0   Olopatadine HCl 0.6 % SOLN, olopatadine 0.6 % nasal spray  USE 2 SPRAYS IN EACH NOSTRIL TWICE A DAY, Disp: , Rfl:    Probiotic Product (PROBIOTIC-10 PO), Take by mouth., Disp: , Rfl:   Consent:   NA  Disposition:   1 year follow-up sooner if needed  Her questions and concerns were addressed to her satisfaction. She voices understanding of the recommendations provided during this encounter.    Signed, Tessa Lerner, DO, [facility]  [facility]  [address]  [address] [place], [place] [address] 02/01/2024 8:33 AM

## 2024-02-21 ENCOUNTER — Ambulatory Visit (INDEPENDENT_AMBULATORY_CARE_PROVIDER_SITE_OTHER): Admitting: Nurse Practitioner

## 2024-02-21 ENCOUNTER — Encounter: Payer: Self-pay | Admitting: Nurse Practitioner

## 2024-02-21 VITALS — BP 118/78 | HR 97 | Ht 63.75 in | Wt 177.0 lb

## 2024-02-21 DIAGNOSIS — L292 Pruritus vulvae: Secondary | ICD-10-CM

## 2024-02-21 DIAGNOSIS — B3731 Acute candidiasis of vulva and vagina: Secondary | ICD-10-CM | POA: Diagnosis not present

## 2024-02-21 LAB — WET PREP FOR TRICH, YEAST, CLUE

## 2024-02-21 MED ORDER — FLUCONAZOLE 150 MG PO TABS
150.0000 mg | ORAL_TABLET | ORAL | 0 refills | Status: AC
Start: 2024-02-21 — End: ?

## 2024-02-21 NOTE — Progress Notes (Signed)
   Acute Office Visit  Subjective:    Patient ID: Rhonda Drake, female    DOB: [birth year], 48 y.o.   MRN: [id number]   HPI 48 y.o. presents today for vulvar itching and discharge x 2 weeks. Switched full-boy deodorant brand about a month ago. Denies urinary symptoms.   Patient's last menstrual period was 02/13/2024 (exact date). Period Cycle (Days): 28 Period Duration (Days): 2 Period Pattern: Regular Menstrual Flow: Light Menstrual Control: Maxi pad Dysmenorrhea: (!) Mild  Review of Systems  Constitutional: Negative.   Genitourinary:  Positive for vaginal discharge and vaginal pain (Vulvar itching). Negative for genital sores.       Objective:    Physical Exam Constitutional:      Appearance: Normal appearance.  Genitourinary:    Labia:        Right: Rash present.        Left: Rash present.      Vagina: Normal.     Cervix: Normal.       BP 118/78 (BP Location: Left Arm, Patient Position: Sitting, Cuff Size: Normal)   Pulse 97   Ht 5' 3.75" (1.619 m)   Wt 177 lb (80.3 kg)   LMP 02/13/2024 (Exact Date)   SpO2 100%   BMI 30.62 kg/m  Wt Readings from Last 3 Encounters:  02/21/24 177 lb (80.3 kg)  02/01/24 180 lb 12.8 oz (82 kg)  12/22/23 182 lb (82.6 kg)        Patient informed chaperone available to be present for breast and/or pelvic exam. Patient has requested no chaperone to be present. Patient has been advised what will be completed during breast and pelvic exam.   Wet prep + yeast  Assessment & Plan:   Problem List Items Addressed This Visit   None Visit Diagnoses       Vaginal candidiasis    -  Primary   Relevant Medications   fluconazole (DIFLUCAN) 150 MG tablet     Vulvar itching       Relevant Orders   WET PREP FOR TRICH, YEAST, CLUE      Plan: Wet prep positive for yeast - Diflucan 150 mg every 3 days x 2 doses. Recommend switching back to previous deodorant.   Return if symptoms worsen or fail to improve.    Andee Bamberger DNP, 11:53 AM 02/21/2024

## 2024-02-29 ENCOUNTER — Other Ambulatory Visit: Payer: Self-pay

## 2024-02-29 MED ORDER — ATORVASTATIN CALCIUM 10 MG PO TABS: 10.0000 mg | ORAL_TABLET | Freq: Every day | ORAL | 3 refills | Status: AC

## 2024-03-08 ENCOUNTER — Other Ambulatory Visit: Payer: Self-pay | Admitting: Obstetrics and Gynecology

## 2024-03-08 DIAGNOSIS — Z3041 Encounter for surveillance of contraceptive pills: Secondary | ICD-10-CM

## 2024-03-08 NOTE — Telephone Encounter (Signed)
 Med refill request: Rhonda Drake  Last AEX: 12/22/23 Next AEX: 12/26/24 Last MMG (if hormonal med) 07/02/23 BI-RADS 1 negative Refill authorized: Rhonda Drake  Please approve or deny as appropriate.

## 2024-03-17 ENCOUNTER — Other Ambulatory Visit: Payer: Self-pay

## 2024-03-17 MED ORDER — METOPROLOL SUCCINATE ER 50 MG PO TB24: 50.0000 mg | ORAL_TABLET | Freq: Every day | ORAL | 3 refills | Status: AC

## 2024-05-02 ENCOUNTER — Encounter: Payer: Self-pay | Admitting: Radiology

## 2024-05-02 ENCOUNTER — Telehealth: Payer: Self-pay | Admitting: *Deleted

## 2024-05-02 ENCOUNTER — Ambulatory Visit: Admitting: Radiology

## 2024-05-02 VITALS — BP 114/76 | HR 92 | Temp 98.7°F | Wt 181.6 lb

## 2024-05-02 DIAGNOSIS — N952 Postmenopausal atrophic vaginitis: Secondary | ICD-10-CM

## 2024-05-02 DIAGNOSIS — N760 Acute vaginitis: Secondary | ICD-10-CM | POA: Diagnosis not present

## 2024-05-02 DIAGNOSIS — N644 Mastodynia: Secondary | ICD-10-CM

## 2024-05-02 LAB — WET PREP FOR TRICH, YEAST, CLUE

## 2024-05-02 MED ORDER — CLOBETASOL PROPIONATE 0.05 % EX OINT: 1.0000 | TOPICAL_OINTMENT | Freq: Every day | CUTANEOUS | 0 refills | Status: AC

## 2024-05-02 MED ORDER — ESTRADIOL 0.1 MG/GM VA CREA: 1.0000 g | TOPICAL_CREAM | VAGINAL | 12 refills | Status: AC

## 2024-05-02 MED ORDER — METRONIDAZOLE 0.75 % VA GEL
1.0000 | Freq: Every day | VAGINAL | 0 refills | Status: AC
Start: 2024-05-02 — End: 2024-05-07

## 2024-05-02 NOTE — Progress Notes (Signed)
   Rhonda Drake 05-Sep-1976 [id number]   History:  48 y.o. c/o right breast pain since 11/2023 and recurrent vaginitis since 02/2024, also with right labial swelling with white skin discoloration. Now with vaginal burning, no increased vaginal discharge. Last mammo 8/24 reports bruising after mammogram. Mother has breast cancer.    Gynecologic History Patient's last menstrual period was 04/11/2024 (approximate).   Last mammogram: 8/24. Results were: normal  Obstetric History OB History  Gravida Para Term Preterm AB Living  2 2    2   SAB IAB Ectopic Multiple Live Births          # Outcome Date GA Lbr Len/2nd Weight Sex Type Anes PTL Lv  2 Para           1 Para               The following portions of the patient's history were reviewed and updated as appropriate: allergies, current medications, past family history, past medical history, past social history, past surgical history, and problem list.  Review of Systems  All other systems reviewed and are negative.   Past medical history, past surgical history, family history and social history were all reviewed and documented in the EPIC chart.  Exam:  Vitals:   05/02/24 1009  BP: 114/76  Pulse: 92  Temp: 98.7 F (37.1 C)  TempSrc: Oral  SpO2: 98%  Weight: 181 lb 9.6 oz (82.4 kg)   Body mass index is 31.42 kg/m.  Physical Exam Vitals and nursing note reviewed. Exam conducted with a chaperone present.  Constitutional:      Appearance: Normal appearance. She is normal weight.  Pulmonary:     Effort: Pulmonary effort is normal.  Chest:  Breasts:    Right: Tenderness present.     Left: Normal.      Comments: Subareolar tenderness Genitourinary:    Vagina: Vaginal discharge present. No erythema or bleeding.     Cervix: Normal.     Uterus: Normal.       Comments: Hypopigmentation and superficial skin thickening  Neurological:     Mental Status: She is alert.   Psychiatric:        Mood and Affect: Mood  normal.        Thought Content: Thought content normal.        Judgment: Judgment normal.      Darice Hoit, CMA present for exam  Microscopic wet-mount exam shows clue cells.   Assessment/Plan:   1. 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  Dispense: 30 g; Refill: 0 May use once weekly after to control symptoms  2. Vaginal atrophy - estradiol  (ESTRACE  VAGINAL) 0.1 MG/GM vaginal cream; Place 1 g vaginally 3 (three) times a week.  Dispense: 42.5 g; Refill: 12 May also apply a thin layer to the vulva twice weekly  3. Breast pain Dx mammo and u/s ordered at [facility]     Return if symptoms worsen or fail to improve.  GINETTE SHASTA NOVAK WHNP-BC 10:32 AM 05/02/2024

## 2024-05-02 NOTE — Telephone Encounter (Signed)
-----   Message from CHRZANOWSKI, [place] B sent at 05/02/2024 10:22 AM EDT ----- Regarding: Dx mammo and u/s Please schedule at [facility] for diagnostic right mammo and u/s for subareolar breast pain 3 oclock. Mother has  breast cancer.

## 2024-05-02 NOTE — Telephone Encounter (Signed)
[facility]

## 2024-05-02 NOTE — Patient Instructions (Signed)

## 2024-05-05 LAB — HM MAMMOGRAPHY

## 2024-05-08 ENCOUNTER — Encounter: Payer: Self-pay | Admitting: Radiology

## 2024-12-26 ENCOUNTER — Ambulatory Visit: Admitting: Nurse Practitioner

## 2024-12-26 ENCOUNTER — Ambulatory Visit: Payer: 59 | Admitting: Obstetrics and Gynecology
